# Patient Record
Sex: Male | Born: 2000 | Race: White | Hispanic: No | Marital: Single | Smoking: Never smoker
Health system: Southern US, Community
[De-identification: ages and names within clinical notes are randomized; demographics above are authoritative.]

## PROBLEM LIST (undated history)

## (undated) DIAGNOSIS — L309 Dermatitis, unspecified: Secondary | ICD-10-CM

## (undated) HISTORY — DX: Dermatitis, unspecified: L30.9

---
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
## 2022-01-16 ENCOUNTER — Other Ambulatory Visit: Payer: Self-pay

## 2022-01-16 MED ORDER — SPIRONOLACTONE 100 MG PO TABS
150.0000 mg | ORAL_TABLET | Freq: Two times a day (BID) | ORAL | 11 refills | Status: AC
  Filled 2022-01-16 (×2): qty 90, 30d supply, fill #0
  Filled 2022-06-19: qty 90, 30d supply, fill #1
  Filled 2022-09-11 – 2022-09-14 (×2): qty 90, 30d supply, fill #2
  Filled 2022-11-08 – 2022-11-09 (×3): qty 90, 30d supply, fill #3

## 2022-01-17 ENCOUNTER — Other Ambulatory Visit: Payer: Self-pay

## 2022-01-18 ENCOUNTER — Other Ambulatory Visit: Payer: Self-pay

## 2022-01-22 ENCOUNTER — Other Ambulatory Visit: Payer: Self-pay

## 2022-01-23 ENCOUNTER — Other Ambulatory Visit: Payer: Self-pay

## 2022-01-25 ENCOUNTER — Other Ambulatory Visit: Payer: Self-pay

## 2022-01-26 ENCOUNTER — Other Ambulatory Visit: Payer: Self-pay

## 2022-01-29 ENCOUNTER — Other Ambulatory Visit: Payer: Self-pay

## 2022-01-29 MED ORDER — EUCERIN ORIGINAL HEALING EX CREA
TOPICAL_CREAM | Freq: Two times a day (BID) | CUTANEOUS | 0 refills | Status: AC | PRN
Start: 2022-01-27 — End: ?
  Filled 2022-01-29: qty 120, 10d supply, fill #0

## 2022-01-30 ENCOUNTER — Other Ambulatory Visit: Payer: Self-pay

## 2022-02-06 ENCOUNTER — Other Ambulatory Visit: Payer: Self-pay

## 2022-02-06 DIAGNOSIS — L2089 Other atopic dermatitis: Secondary | ICD-10-CM | POA: Diagnosis not present

## 2022-02-06 MED ORDER — PREDNISONE 20 MG PO TABS
ORAL_TABLET | ORAL | 0 refills | Status: DC
  Filled 2022-02-06: qty 15, 10d supply, fill #0

## 2022-02-06 MED ORDER — TRIAMCINOLONE ACETONIDE 0.1 % EX OINT
TOPICAL_OINTMENT | Freq: Two times a day (BID) | CUTANEOUS | 2 refills | Status: DC
  Filled 2022-02-06: qty 80, 30d supply, fill #0

## 2022-02-19 ENCOUNTER — Other Ambulatory Visit: Payer: Self-pay

## 2022-02-19 DIAGNOSIS — L299 Pruritus, unspecified: Secondary | ICD-10-CM | POA: Diagnosis not present

## 2022-02-19 MED ORDER — PREDNISONE 20 MG PO TABS
ORAL_TABLET | ORAL | 0 refills | Status: AC
  Filled 2022-02-19: qty 18, 9d supply, fill #0

## 2022-02-19 MED ORDER — HYDROXYZINE HCL 25 MG PO TABS
25.0000 mg | ORAL_TABLET | Freq: Three times a day (TID) | ORAL | 0 refills | Status: AC | PRN
  Filled 2022-02-19: qty 30, 10d supply, fill #0

## 2022-02-21 ENCOUNTER — Other Ambulatory Visit: Payer: Self-pay

## 2022-02-21 MED ORDER — EPINEPHRINE 0.3 MG/0.3ML IJ SOAJ
0.3000 mg | Freq: Once | INTRAMUSCULAR | 2 refills | Status: AC
  Filled 2022-02-21: qty 2, 2d supply, fill #0
  Filled 2022-02-21: qty 0.3, 1d supply, fill #0

## 2022-03-06 ENCOUNTER — Other Ambulatory Visit: Payer: Self-pay

## 2022-03-07 ENCOUNTER — Other Ambulatory Visit: Payer: Self-pay

## 2022-03-07 DIAGNOSIS — L500 Allergic urticaria: Secondary | ICD-10-CM | POA: Diagnosis not present

## 2022-03-07 DIAGNOSIS — Z9109 Other allergy status, other than to drugs and biological substances: Secondary | ICD-10-CM | POA: Diagnosis not present

## 2022-03-07 MED ORDER — CETIRIZINE HCL 10 MG PO TABS
10.0000 mg | ORAL_TABLET | Freq: Two times a day (BID) | ORAL | 11 refills | Status: AC
  Filled 2022-03-07: qty 60, 30d supply, fill #0

## 2022-03-07 MED ORDER — MONTELUKAST SODIUM 10 MG PO TABS
10.0000 mg | ORAL_TABLET | Freq: Every day | ORAL | 11 refills | Status: DC
  Filled 2022-03-07: qty 30, 30d supply, fill #0

## 2022-03-16 ENCOUNTER — Other Ambulatory Visit: Payer: Self-pay

## 2022-03-16 ENCOUNTER — Emergency Department
Admission: EM | Admit: 2022-03-16 | Discharge: 2022-03-16 | Disposition: A | Discharge status: Home / Self Care | Payer: Commercial Managed Care - PPO | Attending: Emergency Medicine | Admitting: Emergency Medicine

## 2022-03-16 DIAGNOSIS — R21 Rash and other nonspecific skin eruption: Secondary | ICD-10-CM | POA: Diagnosis present

## 2022-03-16 DIAGNOSIS — L509 Urticaria, unspecified: Secondary | ICD-10-CM | POA: Diagnosis not present

## 2022-03-16 MED ORDER — PREDNISONE 10 MG PO TABS
ORAL_TABLET | ORAL | 0 refills | Status: AC
  Filled 2022-03-16: qty 56, 26d supply, fill #0

## 2022-03-16 NOTE — ED Triage Notes (Signed)
Pt here with an allergic reaction to an unknown cause. Pt states it started in Dec in her face but then spread all over her body. Pt states she has went to another facility but was not told what was causing this reaction. Pt stable in triage.

## 2022-03-16 NOTE — ED Provider Notes (Signed)
[facility] Provider Note    Event Date/Time   First MD Initiated Contact with Patient 03/16/22 [phone]     (approximate)   History   Allergic Reaction   HPI  ZAM ENT is a 22 y.o. male with a past history of allergies who comes ED complaining of generalized rash which is red itchy, been present for the past 3 or 4 months.  This has been a chronic issue that has been managed by primary care in the past.  They recently did allergy blood testing and identified several contributing factors.  Patient has been taking hydroxyzine, Singulair and Zyrtec.  Did a short steroid burst which helped temporarily but now symptoms are worsened.  Denies fevers chills chest pain shortness of breath throat swelling.  No tick exposures or camping or other outdoorsy activities.     Physical Exam   Triage Vital Signs: ED Triage Vitals [03/16/22 0842]  Enc Vitals Group     BP (!) 142/118     Pulse Rate 93     Resp 18     Temp (!) 97.4 F (36.3 C)     Temp Source Oral     SpO2 93 %     Weight 113 lb 1.5 oz (51.3 kg)     Height '5\' 5"'$  (1.651 m)     Head Circumference      Peak Flow      Pain Score 8     Pain Loc      Pain Edu?      Excl. in [place]?     Most recent vital signs: Vitals:   03/16/22 0842  BP: (!) 142/118  Pulse: 93  Resp: 18  Temp: (!) 97.4 F (36.3 C)  SpO2: 93%    General: Awake, no distress.  CV:  Good peripheral perfusion.  Regular rate and rhythm Resp:  Normal effort.  Clear to auscultation bilaterally.  No wheezing Abd:  No distention.  Other:  Moist oral mucosa.  No oropharyngeal swelling, no tongue elevation.  There is generalized urticaria.  No petechia purpura or bullae.  No target lesions   ED Results / Procedures / Treatments   Labs (all labs ordered are listed, but only abnormal results are displayed) Labs Reviewed - No data to display   RADIOLOGY    PROCEDURES:  Procedures   MEDICATIONS ORDERED IN ED: Medications -  No data to display   IMPRESSION / MDM / [place] / ED COURSE  I reviewed the triage vital signs and the nursing notes.                              Patient presents with urticaria, consistent with allergic reaction or atopy.  No edema, no wheezing/stridor.  Continue antihistamines.  Will restart on a long prednisone taper.  Patient has an appointment with dermatology coming up, recommended they seek a allergy immunology specialist as well.  I do not see any reason to suspect RMSF, Lyme, syphilis.  Patient is nontoxic and stable for discharge       FINAL CLINICAL IMPRESSION(S) / ED DIAGNOSES   Final diagnoses:  Urticaria     Rx / DC Orders   ED Discharge Orders          Ordered    predniSONE (DELTASONE) 10 MG tablet  Daily        03/16/22 0903  Note:  This document was prepared using Dragon voice recognition software and may include unintentional dictation errors.   Carrie Mew, MD 03/16/22 [phone]

## 2022-03-27 ENCOUNTER — Other Ambulatory Visit: Payer: Self-pay

## 2022-03-27 ENCOUNTER — Encounter: Payer: Self-pay | Admitting: Internal Medicine

## 2022-03-27 ENCOUNTER — Ambulatory Visit (INDEPENDENT_AMBULATORY_CARE_PROVIDER_SITE_OTHER): Payer: Commercial Managed Care - PPO | Admitting: Internal Medicine

## 2022-03-27 VITALS — BP 102/72 | HR 90 | Temp 98.7°F | Resp 16 | Ht 66.0 in | Wt 120.7 lb

## 2022-03-27 DIAGNOSIS — J3089 Other allergic rhinitis: Secondary | ICD-10-CM

## 2022-03-27 DIAGNOSIS — J302 Other seasonal allergic rhinitis: Secondary | ICD-10-CM | POA: Diagnosis not present

## 2022-03-27 DIAGNOSIS — L2084 Intrinsic (allergic) eczema: Secondary | ICD-10-CM

## 2022-03-27 DIAGNOSIS — L501 Idiopathic urticaria: Secondary | ICD-10-CM | POA: Diagnosis not present

## 2022-03-27 MED ORDER — AZELASTINE HCL 0.1 % NA SOLN
1.0000 | Freq: Two times a day (BID) | NASAL | 5 refills | Status: AC | PRN
  Filled 2022-03-27: qty 30, 30d supply, fill #0
  Filled 2022-08-31: qty 30, 30d supply, fill #1

## 2022-03-27 MED ORDER — HYDROCORTISONE 2.5 % EX CREA
TOPICAL_CREAM | Freq: Two times a day (BID) | CUTANEOUS | 5 refills | Status: AC
  Filled 2022-03-27: qty 30, 7d supply, fill #0

## 2022-03-27 MED ORDER — PIMECROLIMUS 1 % EX CREA
TOPICAL_CREAM | CUTANEOUS | 5 refills | Status: AC
  Filled 2022-03-27: qty 100, 30d supply, fill #0

## 2022-03-27 MED ORDER — FAMOTIDINE 20 MG PO TABS
20.0000 mg | ORAL_TABLET | Freq: Two times a day (BID) | ORAL | 5 refills | Status: AC | PRN
  Filled 2022-03-27: qty 60, 30d supply, fill #0
  Filled 2022-08-31: qty 60, 30d supply, fill #1

## 2022-03-27 MED ORDER — TRIAMCINOLONE ACETONIDE 0.1 % EX OINT
TOPICAL_OINTMENT | CUTANEOUS | 5 refills | Status: AC
  Filled 2022-03-27: qty 80, 30d supply, fill #0
  Filled 2022-04-11: qty 80, 30d supply, fill #1
  Filled 2022-06-06: qty 80, 30d supply, fill #2

## 2022-03-27 MED ORDER — FEXOFENADINE HCL 180 MG PO TABS
180.0000 mg | ORAL_TABLET | Freq: Two times a day (BID) | ORAL | 5 refills | Status: AC | PRN
  Filled 2022-03-27: qty 60, 30d supply, fill #0

## 2022-03-27 MED ORDER — MONTELUKAST SODIUM 10 MG PO TABS
10.0000 mg | ORAL_TABLET | Freq: Every day | ORAL | 5 refills | Status: DC
  Filled 2022-03-27: qty 30, 30d supply, fill #0

## 2022-03-27 NOTE — Progress Notes (Signed)
NEW PATIENT  Date of Service/Encounter:  03/27/22  Consult requested by: Wardell Honour, MD   Subjective:   Michael [name] (DOB: 2001/01/11) is a 22 yo adult who presents to the clinic on 03/27/2022 with a chief complaint of Allergic Rhinitis , Eczema, and Urticaria .    History obtained from: chart review and patient.   Hives: Started over a year ago but worsened since December.  Now breaking out almost several times a week lasting a couple of days. Initially mostly with hives on face but now the rest of the body. Unable to pinpoint a trigger.  Was seen in the ER recently for hives and given a prednisone taper; he is down to '10mg'$  daily.  Also on Singulair '10mg'$  daily. Previously using Zyrtec and Benadryl PRN but has not had it in a few days for this visit. Denies any illness or new medications.  Does not eat much red meat, no tick bites.  Denies scarring or pain with hives.  No episodes of swelling.   Rhinitis:  Started in childhood. Symptoms include: nasal congestion, rhinorrhea, sneezing, watery eyes, and itchy eyes  Occurs seasonally-Spring Potential triggers: pollen Treatments tried:  Flonase PRN Zyrtec PRN  Previous allergy testing: yes with blood testing that showed reactivity trees, grasses, weeds, dust mite, mold, dog, cat History of reflux/heartburn: no History of sinus surgery: no Nonallergic triggers: none   Atopic Dermatitis:  Diagnosed in childhood.  Areas that flare commonly are all over but worse in arms and neck Current regimen: aquaphor ointment, triamcinolone PRN Reports use of fragrance/dye free products Identified triggers of flares include not sure Sleep is not affected Also reports having peeling skin and is due to see Dermatology next month.   Denies any history of frequent infections.  Past Medical History: Past Medical History:  Diagnosis Date   Eczema    Past Surgical History: History reviewed. No pertinent surgical history.  Family  History: History reviewed. No pertinent family history.  Social History:  Lives in a 1-2 year house Flooring in bedroom: wood Pets: cat Tobacco use/exposure: none Job: food [employer]  Medication List:  Allergies as of 03/27/2022   No Known Allergies      Medication List        Accurate as of March 27, 2022  5:20 PM. If you have any questions, ask your nurse or doctor.          Azelastine HCl 137 MCG/SPRAY Soln Place 1 spray into both nostrils 2 (two) times daily as needed for rhinitis or allergies. Use in each nostril as directed Started by: Larose Kells, MD   cetirizine 10 MG tablet Commonly known as: ZYRTEC Take 1 tablet (10 mg total) by mouth 2 (two) times daily   estradiol 2 MG tablet Commonly known as: ESTRACE TAKE 1 TABLET BY MOUTH ONCE DAILY   estradiol 2 MG tablet Commonly known as: ESTRACE Take 2 tablets (4 mg total) by mouth once daily   estradiol 2 MG tablet Commonly known as: ESTRACE Take 2 tablets (4 mg total) by mouth once daily   estradiol 2 MG tablet Commonly known as: ESTRACE Take 3 tablets (6 mg total) by mouth once daily   famotidine 20 MG tablet Commonly known as: Pepcid Take 1 tablet (20 mg total) by mouth 2 (two) times daily as needed. Started by: Larose Kells, MD   fexofenadine 180 MG tablet Commonly known as: Allegra Allergy Take 1 tablet (180 mg total) by mouth 2 (two)  times daily as needed. Started by: Larose Kells, MD   fluticasone 50 MCG/ACT nasal spray Commonly known as: FLONASE Place 2 sprays into both nostrils daily.   hydrocortisone 2.5 % cream Apply topically 2 (two) times daily. Apply twice daily for flare ups on face, maximum 7 days. Started by: Larose Kells, MD   hydrOXYzine 25 MG tablet Commonly known as: ATARAX Take 1 tablet (25 mg total) by mouth 3 (three) times daily as needed for itching.   meclizine 25 MG tablet Commonly known as: ANTIVERT Take 1 tablet (25 mg total) by mouth 3 (three) times daily as  needed for dizziness.   montelukast 10 MG tablet Commonly known as: SINGULAIR Take 1 tablet (10 mg total) by mouth at bedtime   Opzelura 1.5 % Crea Generic drug: Ruxolitinib Phosphate Apply a thin layer twice daily to affected areas. Do no exceed one tube in a week   pimecrolimus 1 % cream Commonly known as: ELIDEL Apply twice daily and wean as tolerated. Started by: Larose Kells, MD   predniSONE 20 MG tablet Commonly known as: DELTASONE Take 2 tablets (40 mg total) by mouth once daily   predniSONE 5 MG tablet Commonly known as: DELTASONE Take 6 tabs daily for 2 days, 5 tabs daily for 2 days, 4 tabs daily for 2 days, 3 tabs daily for 2 days, 2 tabs for 2 days, 1 tab daily for 2 days then stop.   predniSONE 10 MG tablet Commonly known as: DELTASONE Take 5 tablets (50 mg total) by mouth daily for 3 days, THEN 4 tablets (40 mg total) daily for 3 days, THEN 3 tablets (30 mg total) daily for 3 days, THEN 2 tablets (20 mg total) daily for 3 days, THEN 1 tablet (10 mg total) daily for 14 days. Start taking on: March 16, 2022   spironolactone 100 MG tablet Commonly known as: ALDACTONE TAKE 1 TABLET BY MOUTH 2 TIMES DAILY   spironolactone 100 MG tablet Commonly known as: ALDACTONE Take 1 tablet (100 mg total) by mouth 2 (two) times daily   spironolactone 100 MG tablet Commonly known as: ALDACTONE Take 1.5 tablets (150 mg total) by mouth 2 (two) times daily.   triamcinolone cream 0.1 % Commonly known as: KENALOG Apply to the affected area(s) two times daily (Apply topically 2 (two) times daily) What changed: Another medication with the same name was changed. Make sure you understand how and when to take each. Changed by: Larose Kells, MD   triamcinolone ointment 0.1 % Commonly known as: KENALOG Apply twice daily for flare ups below face, maximum 10 days. What changed:  how to take this when to take this additional instructions Changed by: Larose Kells, MD    triamcinolone cream-eucerin cream Apply to affected skin two times daily as needed (Compounded 1:1 with Eucerin. Apply to affected skin BID prn.)   triamcinolone cream-Eucerin Original Healing Apply to affected skin 2 (two) times daily as needed.   valACYclovir 1000 MG tablet Commonly known as: VALTREX Take 1 tablet (1,000 mg total) by mouth 2 (two) times daily.         REVIEW OF SYSTEMS: Pertinent positives and negatives discussed in HPI.   Objective:   Physical Exam: BP 102/72   Pulse 90   Temp 98.7 F (37.1 C) (Temporal)   Resp 16   Ht '5\' 6"'$  (1.676 m)   Wt 120 lb 11.2 oz (54.7 kg)   SpO2 99%   BMI 19.48 kg/m  Body mass index is 19.48 kg/m. GEN: alert, well developed HEENT: clear conjunctiva, TM grey and translucent, nose with + inferior turbinate hypertrophy, pink nasal mucosa, slight clear rhinorrhea, no cobblestoning HEART: regular rate and rhythm, no murmur LUNGS: clear to auscultation bilaterally, no coughing, unlabored respiration ABDOMEN: soft, non distended  SKIN: no rashes or lesions  Reviewed:  03/16/2022: seen in ER for urticaria, noted to have diffuse urticaria on exam.  Started on long prednisone taper and discharged home.  03/07/2022: seen by [place] PA for rash.  Given prednisone at last visit. Had multiple positives to environmental and food allergens.   Was given Epipen.  Not avoiding allergens.  Uses hydroxyzine PRN and benadryl PRN. Also informed to start Zyrtec '10mg'$  BID and Singulair daily.  02/06/2022: seen for eczema with frequent flare ups.  On triamcinolone, aquaphor.  Referred to Dermatology and given prednisone taper.   Skin Testing:  Skin prick testing was placed, which includes aeroallergens/foods, histamine control, and saline control.  Verbal consent was obtained prior to placing test.  Patient tolerated procedure well.  Allergy testing results were read and interpreted by myself, documented by clinical staff. Adequate positive and  negative control.  Results discussed with patient/family.  Airborne Adult Perc - 03/27/22 1447     Time Antigen Placed 1447    Allergen Manufacturer Lavella Hammock    Location Back    Number of Test 58    1. Control-Buffer 50% Glycerol Negative    2. Control-Histamine 1 mg/ml 3+    3. Albumin saline Negative    4. [place] Negative    5. [place] Negative    6. [place] Negative    7. Eldora Blue Negative    9. Perennial Rye Negative    10. Sweet Vernal Negative    11. Timothy 2+    12. Cocklebur Negative    13. Burweed Marshelder Negative    14. Ragweed, short 3+    15. Ragweed, Giant 2+    16. Plantain,  English Negative    17. Lamb's Quarters Negative    18. Sheep Sorrell 3+    19. Rough Pigweed Negative    20. Marsh Elder, Rough Negative    21. Mugwort, Common Negative    22. Ash mix Negative    23. Birch mix 3+    24. Beech American 3+    25. Box, Elder Negative    26. Cedar, red 3+    27. Cottonwood, Eastern 3+    28. Elm mix Negative    29. Hickory 3+    30. Maple mix 2+    31. Oak, [place] mix 3+    32. Pecan Pollen Negative    33. Pine mix Negative    34. Sycamore Eastern Negative    35. [place], Black Pollen Negative    36. Alternaria alternata Negative    37. Cladosporium Herbarum 2+    38. Aspergillus mix 2+    39. Penicillium mix 2+    40. Bipolaris sorokiniana (Helminthosporium) Negative    41. Drechslera spicifera (Curvularia) Negative    42. Mucor plumbeus Negative    43. Fusarium moniliforme Negative    44. Aureobasidium pullulans (pullulara) Negative    45. Rhizopus oryzae Negative    46. Botrytis cinera Negative    47. Epicoccum nigrum Negative    48. Phoma betae Negative    49. Candida Albicans Negative    50. Trichophyton mentagrophytes 2+    51. Mite, D Farinae  5,000 AU/ml 3+  52. Mite, D Pteronyssinus  5,000 AU/ml 3+    53. Cat Hair 10,000 BAU/ml 2+    54.  Dog Epithelia Negative    55. Mixed Feathers Negative    56. Horse Epithelia 3+     57. Cockroach, German 2+    58. Mouse Negative    59. Tobacco Leaf Negative               Assessment:   1. Idiopathic urticaria   2. Intrinsic atopic dermatitis   3. Seasonal and perennial allergic rhinitis     Plan/Recommendations:  Idiopathic Urticaria (Hives): - At this time etiology of hives and swelling is unknown. Hives can be caused by a variety of different triggers including illness/infection, exercise, pressure, vibrations, extremes of temperature to name a few however majority of the time there is no identifiable trigger.  - If persistent, we can consider lab testing at next visit.   -Start Allegra '180mg'$  twice daily.  Continue Singulair '10mg'$  daily.  -If no improvement in 5-7 days, add Pepcid '20mg'$  twice daily and continue Allegra '180mg'$  twice daily. -If still no improvement, increase to Allegra '360mg'$  twice daily and Pepcid '40mg'$  twice daily.  Eczema: - Do a daily soaking tub bath in warm water for 10-15 minutes.  - Use a gentle, unscented cleanser at the end of the bath (such as Dove unscented bar or baby wash, or Aveeno sensitive body wash). Then rinse, pat half-way dry, and apply a gentle, unscented moisturizer cream or ointment (Cerave, Cetaphil, Eucerin, Aveeno, Aquaphor)  all over while still damp. Dry skin makes the itching and rash of eczema worse. The skin should be moisturized with a gentle, unscented moisturizer at least twice daily.  - Use only unscented liquid laundry detergent. - Apply prescribed topical steroid (triamcinolone 0.1% below neck or hydrocortisone 2.5% above neck) to flared areas (red and thickened eczema) after the moisturizer has soaked into the skin (wait at least 30 minutes). Taper off the topical steroids as the skin improves. Do not use topical steroid for more than 7-10 days at a time.  - Put Protopic/Elidel onto areas of rough eczema (that is not red) twice a day. May decrease to once a day as the eczema improves. This will not thin the skin,  and is safe for chronic use. Do not put this onto normal appearing skin. - Consider Dupixent.   - Follow up with Dermatology regarding skin peeling.    Allergic Rhinitis: - Due to turbinate hypertrophy, seasonal symptoms and unresponsive to OTC meds, performed skin testing to identify aeroallergen triggers.   - Positive skin test 03/2022: trees, grasses, weeds, mold, dust mite, cat, cockroach, horse  - Avoidance measures discussed. - Use nasal saline rinses before nose sprays such as with Neilmed Sinus Rinse.  Use distilled water.   - Use Azelastine 1-2 sprays each nostril twice daily as needed. Aim upward and outward. - Use Allegra '180mg'$  daily as needed.   - Use Singulair '10mg'$  daily.  Stop if there are any mood/behavioral changes. - Consider allergy shots as long term control of your symptoms by teaching your immune system to be more tolerant of your allergy triggers     Return in about 6 weeks (around 05/08/2022).  Harlon Flor, MD [facility] [place] [facility] [place]

## 2022-03-27 NOTE — Patient Instructions (Addendum)
Idiopathic Urticaria (Hives): - At this time etiology of hives and swelling is unknown. Hives can be caused by a variety of different triggers including illness/infection, exercise, pressure, vibrations, extremes of temperature to name a few however majority of the time there is no identifiable trigger.  -Start Allegra '180mg'$  twice daily.  Continue Singulair '10mg'$  daily.  -If no improvement in 5-7 days, add Pepcid '20mg'$  twice daily and continue Allegra '180mg'$  twice daily. -If still no improvement, increase to Allegra '360mg'$  twice daily and Pepcid '40mg'$  twice daily.  Eczema: - Do a daily soaking tub bath in warm water for 10-15 minutes.  - Use a gentle, unscented cleanser at the end of the bath (such as Dove unscented bar or baby wash, or Aveeno sensitive body wash). Then rinse, pat half-way dry, and apply a gentle, unscented moisturizer cream or ointment (Cerave, Cetaphil, Eucerin, Aveeno, Aquaphor)  all over while still damp. Dry skin makes the itching and rash of eczema worse. The skin should be moisturized with a gentle, unscented moisturizer at least twice daily.  - Use only unscented liquid laundry detergent. - Apply prescribed topical steroid (triamcinolone 0.1% below neck or hydrocortisone 2.5% above neck) to flared areas (red and thickened eczema) after the moisturizer has soaked into the skin (wait at least 30 minutes). Taper off the topical steroids as the skin improves. Do not use topical steroid for more than 7-10 days at a time.  - Put Protopic/Elidel onto areas of rough eczema (that is not red) twice a day. May decrease to once a day as the eczema improves. This will not thin the skin, and is safe for chronic use. Do not put this onto normal appearing skin. - Consider Dupixent.   - Follow up with Dermatology regarding skin peeling.    Allergic Rhinitis: - Due to turbinate hypertrophy, seasonal symptoms and unresponsive to OTC meds, performed skin testing to identify aeroallergen triggers.    - Positive skin test 03/2022: trees, grasses, weeds, mold, dust mite, cat, cockroach, horse  - Avoidance measures discussed. - Use nasal saline rinses before nose sprays such as with Neilmed Sinus Rinse.  Use distilled water.   - Use Azelastine 1-2 sprays each nostril twice daily as needed. Aim upward and outward. - Use Allegra '180mg'$  daily as needed.   - Use Singulair '10mg'$  daily.  Stop if there are any mood/behavioral changes. - Consider allergy shots as long term control of your symptoms by teaching your immune system to be more tolerant of your allergy triggers   ALLERGEN AVOIDANCE MEASURES   Dust Mites Use central air conditioning and heat; and change the filter monthly.  Pleated filters work better than mesh filters.  Electrostatic filters may also be used; wash the filter monthly.  Window air conditioners may be used, but do not clean the air as well as a central air conditioner.  Change or wash the filter monthly. Keep windows closed.  Do not use attic fans.   Encase the mattress, box springs and pillows with zippered, dust proof covers. Wash the bed linens in hot water weekly.   Remove carpet, especially from the bedroom. Remove stuffed animals, throw pillows, dust ruffles, heavy drapes and other items that collect dust from the bedroom. Do not use a humidifier.   Use wood, vinyl or leather furniture instead of cloth furniture in the bedroom. Keep the indoor humidity at 30 - 40%.  Monitor with a humidity gauge.  Molds - Indoor avoidance Use air conditioning to reduce indoor humidity.  Do not use a humidifier. Keep indoor humidity at 30 - 40%.  Use a dehumidifier if needed. In the bathroom use an exhaust fan or open a window after showering.  Wipe down damp surfaces after showering.  Clean bathrooms with a mold-killing solution (diluted bleach, or products like Tilex, etc) at least once a month. In the kitchen use an exhaust fan to remove steam from cooking.  Throw away spoiled  foods immediately, and empty garbage daily.  Empty water pans below self-defrosting refrigerators frequently. Vent the clothes dryer to the outside. Limit indoor houseplants; mold grows in the dirt.  No houseplants in the bedroom. Remove carpet from the bedroom. Encase the mattress and box springs with a zippered encasing.  Molds - Outdoor avoidance Avoid being outside when the grass is being mowed, or the ground is tilled. Avoid playing in leaves, pine straw, hay, etc.  Dead plant materials contain mold. Avoid going into barns or grain storage areas. Remove leaves, clippings and compost from around the home.  Cockroach Limit spread of food around the house; especially keep food out of bedrooms. Keep food and garbage in closed containers with a tight lid.  Never leave food out in the kitchen.  Do not leave out pet food or dirty food bowls. Mop the kitchen floor and wash countertops at least once a week. Repair leaky pipes and faucets so there is no standing water to attract roaches. Plug up cracks in the house through which cockroaches can enter. Use bait stations and approved pesticides to reduce cockroach infestation. Pollen Avoidance Pollen levels are highest during the mid-day and afternoon.  Consider this when planning outdoor activities. Avoid being outside when the grass is being mowed, or wear a mask if the pollen-allergic person must be the one to mow the grass. Keep the windows closed to keep pollen outside of the home. Use an air conditioner to filter the air. Take a shower, wash hair, and change clothing after working or playing outdoors during pollen season. Pet Dander Keep the pet out of your bedroom and restrict it to only a few rooms. Be advised that keeping the pet in only one room will not limit the allergens to that room. Don't pet, hug or kiss the pet; if you do, wash your hands with soap and water. High-efficiency particulate air (HEPA) cleaners run continuously in a  bedroom or living room can reduce allergen levels over time. Regular use of a high-efficiency vacuum cleaner or a central vacuum can reduce allergen levels. Giving your pet a bath at least once a week can reduce airborne allergen.

## 2022-03-28 ENCOUNTER — Other Ambulatory Visit: Payer: Self-pay

## 2022-03-29 ENCOUNTER — Other Ambulatory Visit: Payer: Self-pay

## 2022-04-02 ENCOUNTER — Other Ambulatory Visit: Payer: Self-pay

## 2022-04-03 ENCOUNTER — Other Ambulatory Visit: Payer: Self-pay

## 2022-04-11 ENCOUNTER — Other Ambulatory Visit: Payer: Self-pay

## 2022-04-13 ENCOUNTER — Other Ambulatory Visit: Payer: Self-pay

## 2022-04-17 ENCOUNTER — Other Ambulatory Visit: Payer: Self-pay

## 2022-04-17 DIAGNOSIS — L2089 Other atopic dermatitis: Secondary | ICD-10-CM | POA: Diagnosis not present

## 2022-04-17 MED ORDER — DOXYCYCLINE HYCLATE 100 MG PO CAPS
100.0000 mg | ORAL_CAPSULE | Freq: Two times a day (BID) | ORAL | 0 refills | Status: AC
  Filled 2022-04-17: qty 14, 7d supply, fill #0

## 2022-04-17 MED ORDER — BETAMETHASONE DIPROPIONATE AUG 0.05 % EX OINT
1.0000 | TOPICAL_OINTMENT | Freq: Two times a day (BID) | CUTANEOUS | 1 refills | Status: DC
  Filled 2022-04-17: qty 50, 25d supply, fill #0
  Filled 2022-06-06: qty 50, 25d supply, fill #1

## 2022-04-19 ENCOUNTER — Other Ambulatory Visit: Payer: Self-pay

## 2022-04-19 MED ORDER — "BD LUER-LOK SYRINGE 22G X 1"" 3 ML MISC"
2 refills | Status: AC
  Filled 2022-04-19 – 2022-04-23 (×2): qty 12, 84d supply, fill #0

## 2022-04-19 MED ORDER — ESTRADIOL VALERATE 20 MG/ML IM OIL
5.0000 mg | TOPICAL_OIL | INTRAMUSCULAR | 2 refills | Status: DC
  Filled 2022-04-19: qty 5, 90d supply, fill #0

## 2022-04-19 MED ORDER — "BD DISP NEEDLES 27G X 1/2"" MISC"
2 refills | Status: DC
  Filled 2022-04-19: qty 12, 84d supply, fill #0

## 2022-04-23 ENCOUNTER — Other Ambulatory Visit: Payer: Self-pay

## 2022-05-08 ENCOUNTER — Ambulatory Visit: Payer: Commercial Managed Care - PPO | Admitting: Internal Medicine

## 2022-05-08 DIAGNOSIS — J309 Allergic rhinitis, unspecified: Secondary | ICD-10-CM

## 2022-05-09 ENCOUNTER — Ambulatory Visit (INDEPENDENT_AMBULATORY_CARE_PROVIDER_SITE_OTHER): Payer: Commercial Managed Care - PPO | Admitting: Family

## 2022-05-09 ENCOUNTER — Other Ambulatory Visit: Payer: Self-pay

## 2022-05-09 ENCOUNTER — Encounter: Payer: Self-pay | Admitting: Family

## 2022-05-09 VITALS — BP 100/60 | HR 88 | Temp 98.4°F | Resp 16 | Ht 66.0 in | Wt 127.8 lb

## 2022-05-09 DIAGNOSIS — J302 Other seasonal allergic rhinitis: Secondary | ICD-10-CM

## 2022-05-09 DIAGNOSIS — L2084 Intrinsic (allergic) eczema: Secondary | ICD-10-CM | POA: Diagnosis not present

## 2022-05-09 DIAGNOSIS — L501 Idiopathic urticaria: Secondary | ICD-10-CM

## 2022-05-09 DIAGNOSIS — J3089 Other allergic rhinitis: Secondary | ICD-10-CM | POA: Diagnosis not present

## 2022-05-09 MED ORDER — MONTELUKAST SODIUM 10 MG PO TABS
10.0000 mg | ORAL_TABLET | Freq: Every day | ORAL | 5 refills | Status: AC
  Filled 2022-05-09: qty 30, 30d supply, fill #0
  Filled 2022-08-31: qty 30, 30d supply, fill #1

## 2022-05-09 NOTE — Progress Notes (Signed)
[address] [place] [address] Dept: [phone]  FOLLOW UP NOTE  Patient ID: Michael [name], male    DOB: 2000-07-01  Age: 22 y.o. MRN: [id number] Date of Office Visit: 05/09/2022  Assessment  Chief Complaint: Follow-up  HPI Michael [name] is a 22 year old who presents today for follow-up of idiopathic urticaria, intrinsic atopic dermatitis, and seasonal and perennial allergic rhinitis.  Michael [name] was last seen on April 04, 2022 by Dr. Allena Katz .Khian denies any new diagnosis or surgeries since the last office visit.  Idiopathic urticaria: Michael [name] reports that taking hormones was the cause of the hives and itching.  Also, the red dye in Cheerwine caused redness/blotchiness of face.  Michael [name] is no longer taking spironolactone and is taking estradiol.  Michael [name] is no longer taking Allegra, Pepcid, and Singulair daily and is using them just as needed now.  Eczema is reported as clear.  Sometimes Michael [name] eczema will flare on the legs, arms and sometimes neck.  Michael [name] has not had any skin infections since last office visit.  Michael [name] has triamcinolone 0.1% to use as needed, hydrocortisone 2.5% to use as needed, and Protopic or Elidel to use as needed.  Michael [name] reports the peeling on  face is better, but still occurs on scalp. Michael [name] has an upcoming appointment with dermatologist.  Allergic rhinitis: Michael [name] denies rhinorrhea, nasal congestion, and postnasal drip.  Michael [name] has not had any sinus infections since the last office visit.  Michael [name] uses Singulair 10 mg as needed and would like a refill, Allegra 180 mg as needed, and azelastine nasal spray as needed.   Drug Allergies:  No Known Allergies  Review of Systems: Review of Systems  Constitutional:  Negative for chills and fever.  HENT:         Denies rhinorrhea, nasal congestion, and post nasal drip  Eyes:        Denies itchy watery eyes  Respiratory:  Negative for cough, shortness of breath and wheezing.   Cardiovascular:  Negative for  chest pain and palpitations.  Gastrointestinal:        Denies heartburn and reflux  Skin:        Reports peeling skin on scalp. Peeling on face is better  Neurological:  Negative for headaches.  Endo/Heme/Allergies:  Positive for environmental allergies.     Physical Exam: BP 100/60   Pulse 88   Temp 98.4 F (36.9 C) (Temporal)   Resp 16   Ht  (1.676 m)   Wt 127 lb 12.8 oz (58 kg)   SpO2 97%   BMI 20.63 kg/m    Physical Exam Constitutional:      Appearance: Normal appearance. He is normal weight.  HENT:     Head: Normocephalic and atraumatic.     Comments: Pharynx normal. Eyes normal. Ears normal. Ears normal    Right Ear: Tympanic membrane, ear canal and external ear normal.     Left Ear: Tympanic membrane, ear canal and external ear normal.     Nose: Nose normal.     Mouth/Throat:     Mouth: Mucous membranes are moist.     Pharynx: Oropharynx is clear.  Eyes:     Conjunctiva/sclera: Conjunctivae normal.  Cardiovascular:     Rate and Rhythm: Regular rhythm.     Heart sounds: Normal heart sounds.  Pulmonary:     Effort: Pulmonary effort is normal.     Breath sounds: Normal breath sounds.     Comments: Lungs clear to auscultation Musculoskeletal:  Cervical back: Neck supple.  Skin:    General: Skin is warm.     Comments: No rashes or urticarial lesions noted  Neurological:     Mental Status: He is alert and oriented to person, place, and time.  Psychiatric:        Mood and Affect: Mood normal.        Behavior: Behavior normal.        Thought Content: Thought content normal.        Judgment: Judgment normal.     Diagnostics:  none  Assessment and Plan: 1. Idiopathic urticaria   2. Intrinsic atopic dermatitis   3. Seasonal and perennial allergic rhinitis     No orders of the defined types were placed in this encounter.   Patient Instructions  Idiopathic Urticaria (Hives): - Better since stopping spironolactone and Cheerwine -Stop Allegra  180mg  for hives, but may use once a day as needed for allergies.  Continue Singulair 10mg  daily-this will also help allergies.  -Stop Pepcid 20mg  since being off for 2 weeks and no reoccurrence  Eczema: - Do a daily soaking tub bath in warm water for 10-15 minutes.  - Use a gentle, unscented cleanser at the end of the bath (such as Dove unscented bar or baby wash, or Aveeno sensitive body wash). Then rinse, pat half-way dry, and apply a gentle, unscented moisturizer cream or ointment (Cerave, Cetaphil, Eucerin, Aveeno, Aquaphor)  all over while still damp. Dry skin makes the itching and rash of eczema worse. The skin should be moisturized with a gentle, unscented moisturizer at least twice daily.  - Use only unscented liquid laundry detergent. - Apply prescribed topical steroid (triamcinolone 0.1% below neck or hydrocortisone 2.5% above neck) to flared areas (red and thickened eczema) after the moisturizer has soaked into the skin (wait at least 30 minutes). Taper off the topical steroids as the skin improves. Do not use topical steroid for more than 7-10 days at a time.  - Put Protopic/Elidel onto areas of rough eczema (that is not red) twice a day. May decrease to once a day as the eczema improves. This will not thin the skin, and is safe for chronic use. Do not put this onto normal appearing skin. - Consider Dupixent.   - Follow up with Dermatology regarding skin peeling.    Allergic Rhinitis: - Due to turbinate hypertrophy, seasonal symptoms and unresponsive to OTC meds, performed skin testing to identify aeroallergen triggers.   - Positive skin test 03/2022: trees, grasses, weeds, mold, dust mite, cat, cockroach, horse  - Avoidance measures discussed. - Use nasal saline rinses before nose sprays such as with Neilmed Sinus Rinse.  Use distilled water.   - Use Azelastine 1-2 sprays each nostril twice daily as needed. Aim upward and outward. - Use Allegra 180mg  daily as needed.   - Use Singulair  10mg  daily.  Stop if there are any mood/behavioral changes. - Consider allergy shots as long term control of your symptoms by teaching your immune system to be more tolerant of your allergy triggers   Follow up in 4-6 months or sooner if needed  ALLERGEN AVOIDANCE MEASURES   Dust Mites Use central air conditioning and heat; and change the filter monthly.  Pleated filters work better than mesh filters.  Electrostatic filters may also be used; wash the filter monthly.  Window air conditioners may be used, but do not clean the air as well as a central air conditioner.  Change or wash the filter monthly.  Keep windows closed.  Do not use attic fans.   Encase the mattress, box springs and pillows with zippered, dust proof covers. Wash the bed linens in hot water weekly.   Remove carpet, especially from the bedroom. Remove stuffed animals, throw pillows, dust ruffles, heavy drapes and other items that collect dust from the bedroom. Do not use a humidifier.   Use wood, vinyl or leather furniture instead of cloth furniture in the bedroom. Keep the indoor humidity at 30 - 40%.  Monitor with a humidity gauge.  Molds - Indoor avoidance Use air conditioning to reduce indoor humidity.  Do not use a humidifier. Keep indoor humidity at 30 - 40%.  Use a dehumidifier if needed. In the bathroom use an exhaust fan or open a window after showering.  Wipe down damp surfaces after showering.  Clean bathrooms with a mold-killing solution (diluted bleach, or products like Tilex, etc) at least once a month. In the kitchen use an exhaust fan to remove steam from cooking.  Throw away spoiled foods immediately, and empty garbage daily.  Empty water pans below self-defrosting refrigerators frequently. Vent the clothes dryer to the outside. Limit indoor houseplants; mold grows in the dirt.  No houseplants in the bedroom. Remove carpet from the bedroom. Encase the mattress and box springs with a zippered  encasing.  Molds - Outdoor avoidance Avoid being outside when the grass is being mowed, or the ground is tilled. Avoid playing in leaves, pine straw, hay, etc.  Dead plant materials contain mold. Avoid going into barns or grain storage areas. Remove leaves, clippings and compost from around the home.  Cockroach Limit spread of food around the house; especially keep food out of bedrooms. Keep food and garbage in closed containers with a tight lid.  Never leave food out in the kitchen.  Do not leave out pet food or dirty food bowls. Mop the kitchen floor and wash countertops at least once a week. Repair leaky pipes and faucets so there is no standing water to attract roaches. Plug up cracks in the house through which cockroaches can enter. Use bait stations and approved pesticides to reduce cockroach infestation. Pollen Avoidance Pollen levels are highest during the mid-day and afternoon.  Consider this when planning outdoor activities. Avoid being outside when the grass is being mowed, or wear a mask if the pollen-allergic person must be the one to mow the grass. Keep the windows closed to keep pollen outside of the home. Use an air conditioner to filter the air. Take a shower, wash hair, and change clothing after working or playing outdoors during pollen season. Pet Dander Keep the pet out of your bedroom and restrict it to only a few rooms. Be advised that keeping the pet in only one room will not limit the allergens to that room. Don't pet, hug or kiss the pet; if you do, wash your hands with soap and water. High-efficiency particulate air (HEPA) cleaners run continuously in a bedroom or living room can reduce allergen levels over time. Regular use of a high-efficiency vacuum cleaner or a central vacuum can reduce allergen levels. Giving your pet a bath at least once a week can reduce airborne allergen.  Return in about 6 months (around 11/08/2022), or if symptoms worsen or fail to  improve.    Thank you for the opportunity to care for this patient.  Please do not hesitate to contact me with questions.  [name], FNP [facility]

## 2022-05-09 NOTE — Patient Instructions (Addendum)
Idiopathic Urticaria (Hives): - Better since stopping spironolactone and Cheerwine -Stop Allegra  for hives, but may use once a day as needed for allergies.  Continue Singulair  daily-this will also help allergies.  -Stop Pepcid  since being off for 2 weeks and no reoccurrence  Eczema: - Do a daily soaking tub bath in warm water for 10-15 minutes.  - Use a gentle, unscented cleanser at the end of the bath (such as Dove unscented bar or baby wash, or Aveeno sensitive body wash). Then rinse, pat half-way dry, and apply a gentle, unscented moisturizer cream or ointment (Cerave, Cetaphil, Eucerin, Aveeno, Aquaphor)  all over while still damp. Dry skin makes the itching and rash of eczema worse. The skin should be moisturized with a gentle, unscented moisturizer at least twice daily.  - Use only unscented liquid laundry detergent. - Apply prescribed topical steroid (triamcinolone 0.1% below neck or hydrocortisone 2.5% above neck) to flared areas (red and thickened eczema) after the moisturizer has soaked into the skin (wait at least 30 minutes). Taper off the topical steroids as the skin improves. Do not use topical steroid for more than 7-10 days at a time.  - Put Protopic/Elidel onto areas of rough eczema (that is not red) twice a day. May decrease to once a day as the eczema improves. This will not thin the skin, and is safe for chronic use. Do not put this onto normal appearing skin. - Consider Dupixent.   - Follow up with Dermatology regarding skin peeling.    Allergic Rhinitis: - Due to turbinate hypertrophy, seasonal symptoms and unresponsive to OTC meds, performed skin testing to identify aeroallergen triggers.   - Positive skin test 03/2022: trees, grasses, weeds, mold, dust mite, cat, cockroach, horse  - Avoidance measures discussed. - Use nasal saline rinses before nose sprays such as with Neilmed Sinus Rinse.  Use distilled water.   - Use Azelastine 1-2 sprays each nostril twice  daily as needed. Aim upward and outward. - Use Allegra  daily as needed.   - Use Singulair  daily.  Stop if there are any mood/behavioral changes. - Consider allergy shots as long term control of your symptoms by teaching your immune system to be more tolerant of your allergy triggers   Follow up in 4-6 months or sooner if needed  ALLERGEN AVOIDANCE MEASURES   Dust Mites Use central air conditioning and heat; and change the filter monthly.  Pleated filters work better than mesh filters.  Electrostatic filters may also be used; wash the filter monthly.  Window air conditioners may be used, but do not clean the air as well as a central air conditioner.  Change or wash the filter monthly. Keep windows closed.  Do not use attic fans.   Encase the mattress, box springs and pillows with zippered, dust proof covers. Wash the bed linens in hot water weekly.   Remove carpet, especially from the bedroom. Remove stuffed animals, throw pillows, dust ruffles, heavy drapes and other items that collect dust from the bedroom. Do not use a humidifier.   Use wood, vinyl or leather furniture instead of cloth furniture in the bedroom. Keep the indoor humidity at 30 - 40%.  Monitor with a humidity gauge.  Molds - Indoor avoidance Use air conditioning to reduce indoor humidity.  Do not use a humidifier. Keep indoor humidity at 30 - 40%.  Use a dehumidifier if needed. In the bathroom use an exhaust fan or open a window after showering.  Wipe  down damp surfaces after showering.  Clean bathrooms with a mold-killing solution (diluted bleach, or products like Tilex, etc) at least once a month. In the kitchen use an exhaust fan to remove steam from cooking.  Throw away spoiled foods immediately, and empty garbage daily.  Empty water pans below self-defrosting refrigerators frequently. Vent the clothes dryer to the outside. Limit indoor houseplants; mold grows in the dirt.  No houseplants in the  bedroom. Remove carpet from the bedroom. Encase the mattress and box springs with a zippered encasing.  Molds - Outdoor avoidance Avoid being outside when the grass is being mowed, or the ground is tilled. Avoid playing in leaves, pine straw, hay, etc.  Dead plant materials contain mold. Avoid going into barns or grain storage areas. Remove leaves, clippings and compost from around the home.  Cockroach Limit spread of food around the house; especially keep food out of bedrooms. Keep food and garbage in closed containers with a tight lid.  Never leave food out in the kitchen.  Do not leave out pet food or dirty food bowls. Mop the kitchen floor and wash countertops at least once a week. Repair leaky pipes and faucets so there is no standing water to attract roaches. Plug up cracks in the house through which cockroaches can enter. Use bait stations and approved pesticides to reduce cockroach infestation. Pollen Avoidance Pollen levels are highest during the mid-day and afternoon.  Consider this when planning outdoor activities. Avoid being outside when the grass is being mowed, or wear a mask if the pollen-allergic person must be the one to mow the grass. Keep the windows closed to keep pollen outside of the home. Use an air conditioner to filter the air. Take a shower, wash hair, and change clothing after working or playing outdoors during pollen season. Pet Dander Keep the pet out of your bedroom and restrict it to only a few rooms. Be advised that keeping the pet in only one room will not limit the allergens to that room. Don't pet, hug or kiss the pet; if you do, wash your hands with soap and water. High-efficiency particulate air (HEPA) cleaners run continuously in a bedroom or living room can reduce allergen levels over time. Regular use of a high-efficiency vacuum cleaner or a central vacuum can reduce allergen levels. Giving your pet a bath at least once a week can reduce airborne  allergen.

## 2022-06-06 ENCOUNTER — Other Ambulatory Visit: Payer: Self-pay

## 2022-06-22 ENCOUNTER — Other Ambulatory Visit: Payer: Self-pay

## 2022-06-25 ENCOUNTER — Other Ambulatory Visit: Payer: Self-pay

## 2022-06-26 ENCOUNTER — Other Ambulatory Visit: Payer: Self-pay

## 2022-06-27 DIAGNOSIS — Z7251 High risk heterosexual behavior: Secondary | ICD-10-CM | POA: Diagnosis not present

## 2022-06-27 DIAGNOSIS — R319 Hematuria, unspecified: Secondary | ICD-10-CM | POA: Diagnosis not present

## 2022-06-27 DIAGNOSIS — N3001 Acute cystitis with hematuria: Secondary | ICD-10-CM | POA: Diagnosis not present

## 2022-06-28 ENCOUNTER — Other Ambulatory Visit: Payer: Self-pay

## 2022-06-28 MED ORDER — CIPROFLOXACIN HCL 500 MG PO TABS
500.0000 mg | ORAL_TABLET | Freq: Two times a day (BID) | ORAL | 0 refills | Status: AC
  Filled 2022-06-28: qty 14, 7d supply, fill #0

## 2022-07-17 ENCOUNTER — Other Ambulatory Visit: Payer: Self-pay

## 2022-07-18 ENCOUNTER — Other Ambulatory Visit: Payer: Self-pay

## 2022-08-03 ENCOUNTER — Other Ambulatory Visit: Payer: Self-pay

## 2022-08-06 ENCOUNTER — Other Ambulatory Visit: Payer: Self-pay

## 2022-08-20 ENCOUNTER — Ambulatory Visit: Payer: Commercial Managed Care - PPO | Attending: Family Medicine | Admitting: Pharmacist

## 2022-08-20 ENCOUNTER — Other Ambulatory Visit (HOSPITAL_COMMUNITY): Payer: Self-pay

## 2022-08-20 ENCOUNTER — Other Ambulatory Visit: Payer: Self-pay

## 2022-08-20 DIAGNOSIS — Z79899 Other long term (current) drug therapy: Secondary | ICD-10-CM

## 2022-08-20 DIAGNOSIS — L239 Allergic contact dermatitis, unspecified cause: Secondary | ICD-10-CM | POA: Diagnosis not present

## 2022-08-20 MED ORDER — HYDROXYZINE HCL 25 MG PO TABS
25.0000 mg | ORAL_TABLET | Freq: Every evening | ORAL | 3 refills | Status: AC | PRN
  Filled 2022-08-20: qty 30, 30d supply, fill #0
  Filled 2022-09-26: qty 30, 30d supply, fill #1

## 2022-08-20 MED ORDER — DUPIXENT 300 MG/2ML ~~LOC~~ SOSY
PREFILLED_SYRINGE | SUBCUTANEOUS | 5 refills | Status: DC
  Filled 2022-08-20: qty 4, fill #0
  Filled 2022-08-22: qty 4, 14d supply, fill #0
  Filled 2022-08-29: qty 4, 28d supply, fill #1
  Filled 2022-10-01: qty 4, 28d supply, fill #2
  Filled 2022-10-23: qty 4, 28d supply, fill #3
  Filled 2022-11-19: qty 4, 28d supply, fill #4
  Filled 2022-12-17: qty 4, 28d supply, fill #5

## 2022-08-20 MED ORDER — BETAMETHASONE DIPROPIONATE AUG 0.05 % EX OINT
TOPICAL_OINTMENT | Freq: Two times a day (BID) | CUTANEOUS | 2 refills | Status: AC
  Filled 2022-08-20: qty 50, 30d supply, fill #0

## 2022-08-20 MED ORDER — TRIAMCINOLONE ACETONIDE 0.1 % EX OINT
TOPICAL_OINTMENT | Freq: Two times a day (BID) | CUTANEOUS | 3 refills | Status: DC
  Filled 2022-08-20: qty 454, 30d supply, fill #0
  Filled 2022-09-26: qty 454, 90d supply, fill #1
  Filled 2022-11-30: qty 454, 90d supply, fill #2
  Filled 2023-02-23: qty 454, 90d supply, fill #3

## 2022-08-20 MED ORDER — DUPIXENT 300 MG/2ML ~~LOC~~ SOSY: PREFILLED_SYRINGE | SUBCUTANEOUS | 5 refills | Status: DC

## 2022-08-20 NOTE — Progress Notes (Signed)
   S: Patient presents for review of their specialty medication therapy.  Patient is about to start taking Dupixent for atopic dermatitis. Patient is managed by Dr. Sadie Haber for this.   Adherence: has not yet started   Efficacy: has not yet started   Dosing: 600 mg subcutaneous once followed by 300 mg subcutaneous once every 14 days  Dose adjustments: Renal: no dose adjustments (has not been studied) Hepatic: no dose adjustments (has not been studied)  Drug-drug interactions: none  Monitoring: S/sx of infection: none S/sx of hypersensitivity: none S/sx of ocular effects: none S/sx of eosinophilia/vasculitis: none  O:     Lab Results  Component Value Date   WBC 13.8 (H) 04/25/2021   HGB 14.4 04/25/2021   HCT 42.0 04/25/2021   MCV 86.6 04/25/2021   PLT 286 04/25/2021      Chemistry      Component Value Date/Time   NA 135 04/25/2021 0802   K 3.7 04/25/2021 0802   CL 101 04/25/2021 0802   CO2 26 04/25/2021 0802   BUN 14 04/25/2021 0802   CREATININE 0.84 04/25/2021 0802      Component Value Date/Time   CALCIUM 9.6 04/25/2021 0802   ALKPHOS 70 04/25/2021 0802   AST 28 04/25/2021 0802   ALT 27 04/25/2021 0802   BILITOT 1.9 (H) 04/25/2021 0802       A/P: 1. Medication review: Patient is about to be on Dupixent for atopic dermatitis. Reviewed the medication with the patient, including the following: Dupixent is a monoclonal antibody used for the treatment of asthma or atopic dermatitis. Patient educated on purpose, proper use and potential adverse effects of Dupixent. Possible adverse effects include increased risk of infection, ocular effects, vasculitis/eosinophilia, and hypersensitivity reactions. Administer as a SubQ injection and rotate sites. Allow the medication to reach room temp prior to administration (45 mins for 300 mg syringe or 30 min for 200 mg syringe). Do not shake. Discard any unused portion. No recommendations for any changes.   Butch Penny,  PharmD, P[place]y [place], CPP Clinical Pharmacist [facility] [phone]

## 2022-08-21 ENCOUNTER — Other Ambulatory Visit: Payer: Self-pay

## 2022-08-21 ENCOUNTER — Other Ambulatory Visit (HOSPITAL_COMMUNITY): Payer: Self-pay

## 2022-08-22 ENCOUNTER — Other Ambulatory Visit: Payer: Self-pay

## 2022-08-24 ENCOUNTER — Other Ambulatory Visit (HOSPITAL_COMMUNITY): Payer: Self-pay

## 2022-08-27 ENCOUNTER — Other Ambulatory Visit: Payer: Self-pay

## 2022-08-28 ENCOUNTER — Other Ambulatory Visit: Payer: Self-pay

## 2022-08-29 ENCOUNTER — Other Ambulatory Visit: Payer: Self-pay

## 2022-08-31 ENCOUNTER — Other Ambulatory Visit: Payer: Self-pay

## 2022-09-01 ENCOUNTER — Other Ambulatory Visit: Payer: Self-pay

## 2022-09-03 ENCOUNTER — Other Ambulatory Visit (HOSPITAL_COMMUNITY): Payer: Self-pay

## 2022-09-03 ENCOUNTER — Other Ambulatory Visit: Payer: Self-pay

## 2022-09-04 ENCOUNTER — Other Ambulatory Visit: Payer: Self-pay

## 2022-09-11 ENCOUNTER — Other Ambulatory Visit: Payer: Self-pay

## 2022-09-13 ENCOUNTER — Other Ambulatory Visit: Payer: Self-pay

## 2022-09-14 ENCOUNTER — Other Ambulatory Visit: Payer: Self-pay

## 2022-09-18 ENCOUNTER — Other Ambulatory Visit: Payer: Self-pay

## 2022-09-20 ENCOUNTER — Other Ambulatory Visit (HOSPITAL_COMMUNITY): Payer: Self-pay

## 2022-09-24 ENCOUNTER — Other Ambulatory Visit: Payer: Self-pay

## 2022-09-26 ENCOUNTER — Other Ambulatory Visit (HOSPITAL_COMMUNITY): Payer: Self-pay

## 2022-09-26 ENCOUNTER — Other Ambulatory Visit: Payer: Self-pay

## 2022-09-28 ENCOUNTER — Other Ambulatory Visit (HOSPITAL_COMMUNITY): Payer: Self-pay

## 2022-10-01 ENCOUNTER — Other Ambulatory Visit (HOSPITAL_COMMUNITY): Payer: Self-pay

## 2022-10-01 ENCOUNTER — Encounter (HOSPITAL_COMMUNITY): Payer: Self-pay

## 2022-10-02 ENCOUNTER — Other Ambulatory Visit: Payer: Self-pay | Admitting: Pharmacist

## 2022-10-02 ENCOUNTER — Other Ambulatory Visit: Payer: Self-pay

## 2022-10-02 ENCOUNTER — Other Ambulatory Visit (HOSPITAL_COMMUNITY): Payer: Self-pay

## 2022-10-02 DIAGNOSIS — Z79899 Other long term (current) drug therapy: Secondary | ICD-10-CM | POA: Diagnosis not present

## 2022-10-02 DIAGNOSIS — L239 Allergic contact dermatitis, unspecified cause: Secondary | ICD-10-CM | POA: Diagnosis not present

## 2022-10-02 DIAGNOSIS — L810 Postinflammatory hyperpigmentation: Secondary | ICD-10-CM | POA: Diagnosis not present

## 2022-10-02 MED ORDER — DUPIXENT 300 MG/2ML ~~LOC~~ SOSY
PREFILLED_SYRINGE | SUBCUTANEOUS | 5 refills | Status: DC
  Filled 2022-10-02: qty 4, fill #0
  Filled 2023-01-18: qty 4, 28d supply, fill #0
  Filled 2023-02-25: qty 4, 28d supply, fill #1
  Filled 2023-03-20: qty 4, 28d supply, fill #2
  Filled 2023-05-01: qty 4, 28d supply, fill #3
  Filled 2023-05-27: qty 4, 28d supply, fill #4
  Filled 2023-06-24: qty 4, 28d supply, fill #5

## 2022-10-02 MED ORDER — DUPIXENT 300 MG/2ML ~~LOC~~ SOSY: PREFILLED_SYRINGE | SUBCUTANEOUS | 5 refills | Status: DC

## 2022-10-06 ENCOUNTER — Encounter (HOSPITAL_COMMUNITY): Payer: Self-pay

## 2022-10-23 ENCOUNTER — Other Ambulatory Visit: Payer: Self-pay

## 2022-10-23 NOTE — Progress Notes (Signed)
Specialty Pharmacy Refill Coordination Note  Michael [name] is a 22 y.o. adult contacted today regarding refills of specialty medication(s) Dupilumab   Patient requested Delivery   Delivery date: 10/26/22   Verified address: [address] [place] [address], [place], [address]   Medication will be filled on 10/25/2022.

## 2022-10-25 ENCOUNTER — Other Ambulatory Visit: Payer: Self-pay

## 2022-10-29 ENCOUNTER — Other Ambulatory Visit: Payer: Self-pay

## 2022-10-30 ENCOUNTER — Other Ambulatory Visit: Payer: Self-pay

## 2022-10-31 ENCOUNTER — Other Ambulatory Visit: Payer: Self-pay

## 2022-11-08 ENCOUNTER — Other Ambulatory Visit: Payer: Self-pay

## 2022-11-09 ENCOUNTER — Other Ambulatory Visit: Payer: Self-pay

## 2022-11-12 ENCOUNTER — Other Ambulatory Visit: Payer: Self-pay

## 2022-11-13 ENCOUNTER — Other Ambulatory Visit: Payer: Self-pay

## 2022-11-19 ENCOUNTER — Other Ambulatory Visit: Payer: Self-pay

## 2022-11-19 NOTE — Progress Notes (Signed)
Specialty Pharmacy Refill Coordination Note  Michael [name] is a 22 y.o. adult contacted today regarding refills of specialty medication(s) Dupilumab   Patient requested Delivery   Delivery date: 11/23/22   Verified address: [address] [place] [address], [place], [address]   Medication will be filled on 11/22/22.

## 2022-11-30 ENCOUNTER — Other Ambulatory Visit: Payer: Self-pay

## 2022-12-05 ENCOUNTER — Other Ambulatory Visit: Payer: Self-pay

## 2022-12-07 ENCOUNTER — Other Ambulatory Visit: Payer: Self-pay

## 2022-12-12 ENCOUNTER — Other Ambulatory Visit: Payer: Self-pay

## 2022-12-12 DIAGNOSIS — E349 Endocrine disorder, unspecified: Secondary | ICD-10-CM | POA: Diagnosis not present

## 2022-12-12 DIAGNOSIS — Z79899 Other long term (current) drug therapy: Secondary | ICD-10-CM | POA: Diagnosis not present

## 2022-12-12 DIAGNOSIS — R17 Unspecified jaundice: Secondary | ICD-10-CM | POA: Diagnosis not present

## 2022-12-12 DIAGNOSIS — F649 Gender identity disorder, unspecified: Secondary | ICD-10-CM | POA: Diagnosis not present

## 2022-12-12 MED ORDER — PROGESTERONE MICRONIZED 100 MG PO CAPS
100.0000 mg | ORAL_CAPSULE | Freq: Every day | ORAL | 11 refills | Status: DC
  Filled 2022-12-12: qty 30, 30d supply, fill #0
  Filled 2023-01-14: qty 30, 30d supply, fill #1
  Filled 2023-02-13: qty 30, 30d supply, fill #2
  Filled 2023-03-13: qty 30, 30d supply, fill #3
  Filled 2023-04-16: qty 30, 30d supply, fill #4
  Filled 2023-05-13: qty 30, 30d supply, fill #5
  Filled 2023-06-13: qty 30, 30d supply, fill #6
  Filled 2023-07-17: qty 30, 30d supply, fill #7
  Filled 2023-08-12: qty 30, 30d supply, fill #8
  Filled 2023-09-11: qty 30, 30d supply, fill #9
  Filled 2023-10-14: qty 30, 30d supply, fill #10
  Filled 2023-11-13: qty 30, 30d supply, fill #11

## 2022-12-12 MED ORDER — ESTRADIOL 2 MG PO TABS
6.0000 mg | ORAL_TABLET | Freq: Every day | ORAL | 11 refills | Status: AC
Start: 2022-12-12 — End: ?
  Filled 2022-12-12: qty 90, 30d supply, fill #0

## 2022-12-12 MED ORDER — SPIRONOLACTONE 100 MG PO TABS
100.0000 mg | ORAL_TABLET | Freq: Two times a day (BID) | ORAL | 11 refills | Status: AC
  Filled 2022-12-12: qty 60, 30d supply, fill #0
  Filled 2023-02-04: qty 60, 30d supply, fill #1
  Filled 2023-03-13: qty 60, 30d supply, fill #2
  Filled 2023-04-16: qty 60, 30d supply, fill #3
  Filled 2023-05-27: qty 60, 30d supply, fill #4
  Filled 2023-07-01 – 2023-07-02 (×2): qty 60, 30d supply, fill #5
  Filled 2023-08-12: qty 60, 30d supply, fill #6
  Filled 2023-09-11: qty 60, 30d supply, fill #7
  Filled 2023-10-24: qty 60, 30d supply, fill #8
  Filled 2023-12-06: qty 60, 30d supply, fill #9

## 2022-12-14 ENCOUNTER — Other Ambulatory Visit: Payer: Self-pay

## 2022-12-17 ENCOUNTER — Other Ambulatory Visit: Payer: Self-pay

## 2022-12-17 NOTE — Progress Notes (Signed)
Specialty Pharmacy Refill Coordination Note  Michael [name] is a 22 y.o. adult contacted today regarding refills of specialty medication(s) Dupilumab   Patient requested Delivery   Delivery date: 12/26/22   Verified address: [address] [place] [address], [place], [address]   Medication will be filled on 12/25/22.

## 2022-12-18 ENCOUNTER — Other Ambulatory Visit: Payer: Self-pay

## 2022-12-18 MED ORDER — ESTRADIOL 2 MG PO TABS
5.0000 mg | ORAL_TABLET | Freq: Every day | ORAL | 11 refills | Status: AC
  Filled 2022-12-18: qty 90, 36d supply, fill #0
  Filled 2023-01-14: qty 75, 30d supply, fill #0
  Filled 2023-01-18: qty 13, 5d supply, fill #0
  Filled 2023-01-18: qty 77, 31d supply, fill #0
  Filled 2023-02-23: qty 90, 36d supply, fill #1
  Filled 2023-04-03: qty 90, 36d supply, fill #2
  Filled 2023-05-13: qty 90, 36d supply, fill #3
  Filled 2023-06-13: qty 90, 36d supply, fill #4
  Filled 2023-07-24: qty 90, 36d supply, fill #5
  Filled 2023-08-27: qty 90, 36d supply, fill #6
  Filled 2023-09-30: qty 90, 36d supply, fill #7
  Filled 2023-11-04: qty 90, 36d supply, fill #8
  Filled 2023-12-06 – 2023-12-07 (×2): qty 90, 36d supply, fill #9

## 2022-12-18 MED ORDER — ERGOCALCIFEROL 1.25 MG (50000 UT) PO CAPS
1.0000 | ORAL_CAPSULE | ORAL | 1 refills | Status: AC
  Filled 2022-12-18 – 2023-01-14 (×2): qty 8, 56d supply, fill #0

## 2022-12-25 ENCOUNTER — Other Ambulatory Visit: Payer: Self-pay

## 2022-12-31 ENCOUNTER — Other Ambulatory Visit: Payer: Self-pay

## 2023-01-07 ENCOUNTER — Other Ambulatory Visit: Payer: Self-pay

## 2023-01-14 ENCOUNTER — Other Ambulatory Visit: Payer: Self-pay

## 2023-01-15 ENCOUNTER — Other Ambulatory Visit: Payer: Self-pay

## 2023-01-17 ENCOUNTER — Other Ambulatory Visit: Payer: Self-pay

## 2023-01-17 MED ORDER — PROGESTERONE MICRONIZED 100 MG PO CAPS
100.0000 mg | ORAL_CAPSULE | Freq: Every day | ORAL | 11 refills | Status: AC
  Filled 2023-01-17: qty 30, 30d supply, fill #0

## 2023-01-17 MED ORDER — ERGOCALCIFEROL 1.25 MG (50000 UT) PO CAPS
50000.0000 [IU] | ORAL_CAPSULE | ORAL | 1 refills | Status: AC
  Filled 2023-01-17: qty 8, 56d supply, fill #0

## 2023-01-17 MED ORDER — ESTRADIOL 2 MG PO TABS
5.0000 mg | ORAL_TABLET | Freq: Every day | ORAL | 11 refills | Status: DC
  Filled 2023-01-17 – 2023-12-07 (×2): qty 90, 36d supply, fill #0

## 2023-01-18 ENCOUNTER — Other Ambulatory Visit (HOSPITAL_COMMUNITY): Payer: Self-pay

## 2023-01-18 ENCOUNTER — Other Ambulatory Visit (HOSPITAL_COMMUNITY): Payer: Self-pay | Admitting: Pharmacy Technician

## 2023-01-18 ENCOUNTER — Other Ambulatory Visit: Payer: Self-pay

## 2023-01-18 NOTE — Progress Notes (Signed)
 Specialty Pharmacy Refill Coordination Note  Michael [name] is a 23 y.o. adult contacted today regarding refills of specialty medication(s)  Dupilumab     Patient requested (Patient-Rptd) Delivery   Delivery date: (Patient-Rptd) 01/22/22   Verified address: (Patient-Rptd) [address] [place] [address] [address] [address]   Medication will be filled on 01/22/23.

## 2023-01-21 ENCOUNTER — Other Ambulatory Visit: Payer: Self-pay

## 2023-01-22 ENCOUNTER — Other Ambulatory Visit: Payer: Self-pay

## 2023-01-25 ENCOUNTER — Other Ambulatory Visit: Payer: Self-pay

## 2023-01-25 ENCOUNTER — Other Ambulatory Visit (HOSPITAL_COMMUNITY): Payer: Self-pay

## 2023-01-28 ENCOUNTER — Other Ambulatory Visit: Payer: Self-pay

## 2023-01-31 ENCOUNTER — Other Ambulatory Visit: Payer: Self-pay

## 2023-02-04 ENCOUNTER — Other Ambulatory Visit: Payer: Self-pay

## 2023-02-07 ENCOUNTER — Other Ambulatory Visit: Payer: Self-pay

## 2023-02-08 ENCOUNTER — Other Ambulatory Visit: Payer: Self-pay

## 2023-02-13 ENCOUNTER — Other Ambulatory Visit: Payer: Self-pay

## 2023-02-14 ENCOUNTER — Other Ambulatory Visit: Payer: Self-pay

## 2023-02-23 ENCOUNTER — Other Ambulatory Visit: Payer: Self-pay

## 2023-02-24 ENCOUNTER — Other Ambulatory Visit: Payer: Self-pay

## 2023-02-25 ENCOUNTER — Other Ambulatory Visit: Payer: Self-pay

## 2023-02-25 ENCOUNTER — Encounter (HOSPITAL_COMMUNITY): Payer: Self-pay

## 2023-02-25 NOTE — Progress Notes (Signed)
 Specialty Pharmacy Ongoing Clinical Assessment Note  Michael [name] is a 23 y.o. adult who is being followed by the specialty pharmacy service for RxSp Atopic Dermatitis   Patient's specialty medication(s) reviewed today: Dupilumab  (Dupixent )   Missed doses in the last 4 weeks: 0   Patient/Caregiver did not have any additional questions or concerns.   Therapeutic benefit summary: Patient is achieving benefit   Adverse events/side effects summary: No adverse events/side effects   Patient's therapy is appropriate to: Continue    Goals Addressed             This Visit's Progress    Minimize recurrence of flares       Patient is on track. Patient will maintain adherence         Follow up:  6 months  [name] Specialty Pharmacist

## 2023-02-25 NOTE — Progress Notes (Signed)
 Specialty Pharmacy Refill Coordination Note  Michael [name] is a 23 y.o. adult contacted today regarding refills of specialty medication(s) Dupilumab  (Dupixent )   Patient requested Delivery   Delivery date: 03/05/23   Verified address: [address] [place] [address] [address] [address]   Medication will be filled on 03/04/23.

## 2023-03-04 ENCOUNTER — Other Ambulatory Visit (HOSPITAL_COMMUNITY): Payer: Self-pay

## 2023-03-04 ENCOUNTER — Other Ambulatory Visit: Payer: Self-pay

## 2023-03-04 NOTE — Progress Notes (Signed)
Pharmacy Patient Advocate Encounter  Received notification from [facility] that Prior Authorization for Dupixent has been APPROVED from 03/04/23 to 03/02/24   PA #/Case ID/Reference #: [id number]

## 2023-03-04 NOTE — Progress Notes (Signed)
Pharmacy Patient Advocate Encounter   Received notification from Patient Pharmacy that prior authorization for Dupixent is required/requested.   Insurance verification completed.   The patient is insured through [facility] .   Per test claim: PA required; PA submitted to above mentioned insurance via CoverMyMeds Key/confirmation #/EOC [facility] Status is pending

## 2023-03-13 ENCOUNTER — Other Ambulatory Visit: Payer: Self-pay

## 2023-03-14 ENCOUNTER — Other Ambulatory Visit: Payer: Self-pay

## 2023-03-15 ENCOUNTER — Other Ambulatory Visit: Payer: Self-pay

## 2023-03-18 ENCOUNTER — Other Ambulatory Visit: Payer: Self-pay

## 2023-03-18 ENCOUNTER — Other Ambulatory Visit (HOSPITAL_COMMUNITY): Payer: Self-pay

## 2023-03-20 ENCOUNTER — Other Ambulatory Visit: Payer: Self-pay | Admitting: Pharmacy Technician

## 2023-03-20 ENCOUNTER — Other Ambulatory Visit: Payer: Self-pay

## 2023-03-20 NOTE — Progress Notes (Signed)
 Specialty Pharmacy Refill Coordination Note  Michael [name] is a 23 y.o. adult contacted today regarding refills of specialty medication(s) Dupilumab (Dupixent)   Patient requested Delivery   Delivery date: 04/02/23   Verified address: Patient address [address] [address]  [place]   Medication will be filled on 04/01/23.

## 2023-04-01 ENCOUNTER — Other Ambulatory Visit: Payer: Self-pay

## 2023-04-01 DIAGNOSIS — L810 Postinflammatory hyperpigmentation: Secondary | ICD-10-CM | POA: Diagnosis not present

## 2023-04-01 DIAGNOSIS — L239 Allergic contact dermatitis, unspecified cause: Secondary | ICD-10-CM | POA: Diagnosis not present

## 2023-04-01 DIAGNOSIS — Z79899 Other long term (current) drug therapy: Secondary | ICD-10-CM | POA: Diagnosis not present

## 2023-04-01 MED ORDER — DUPIXENT 300 MG/2ML ~~LOC~~ SOSY
PREFILLED_SYRINGE | SUBCUTANEOUS | 11 refills | Status: DC
  Filled 2023-04-01: qty 4, 14d supply, fill #0

## 2023-04-04 ENCOUNTER — Other Ambulatory Visit: Payer: Self-pay

## 2023-04-05 ENCOUNTER — Other Ambulatory Visit: Payer: Self-pay

## 2023-04-09 ENCOUNTER — Other Ambulatory Visit: Payer: Self-pay

## 2023-04-09 DIAGNOSIS — L2089 Other atopic dermatitis: Secondary | ICD-10-CM | POA: Diagnosis not present

## 2023-04-09 DIAGNOSIS — M545 Low back pain, unspecified: Secondary | ICD-10-CM | POA: Diagnosis not present

## 2023-04-09 MED ORDER — CYCLOBENZAPRINE HCL 5 MG PO TABS
5.0000 mg | ORAL_TABLET | Freq: Three times a day (TID) | ORAL | 0 refills | Status: AC | PRN
  Filled 2023-04-09: qty 30, 10d supply, fill #0

## 2023-04-16 ENCOUNTER — Other Ambulatory Visit: Payer: Self-pay

## 2023-04-25 ENCOUNTER — Other Ambulatory Visit: Payer: Self-pay

## 2023-04-29 ENCOUNTER — Other Ambulatory Visit: Payer: Self-pay

## 2023-04-29 DIAGNOSIS — J3489 Other specified disorders of nose and nasal sinuses: Secondary | ICD-10-CM | POA: Diagnosis not present

## 2023-04-29 DIAGNOSIS — R051 Acute cough: Secondary | ICD-10-CM | POA: Diagnosis not present

## 2023-04-29 MED ORDER — PROMETHAZINE-DM 6.25-15 MG/5ML PO SYRP
5.0000 mL | ORAL_SOLUTION | Freq: Every day | ORAL | 0 refills | Status: AC
  Filled 2023-04-29: qty 35, 7d supply, fill #0

## 2023-04-30 DIAGNOSIS — R051 Acute cough: Secondary | ICD-10-CM | POA: Diagnosis not present

## 2023-05-01 ENCOUNTER — Other Ambulatory Visit: Payer: Self-pay

## 2023-05-01 NOTE — Progress Notes (Signed)
 Specialty Pharmacy Refill Coordination Note  Michael [name] is a 23 y.o. adult contacted today regarding refills of specialty medication(s) Dupilumab (Dupixent)   Patient requested Delivery   Delivery date: 05/02/23   Verified address: Patient address [address] [address]  [place]   Medication will be filled on 05/01/23.

## 2023-05-02 ENCOUNTER — Other Ambulatory Visit: Payer: Self-pay

## 2023-05-02 DIAGNOSIS — R0981 Nasal congestion: Secondary | ICD-10-CM | POA: Diagnosis not present

## 2023-05-02 MED ORDER — AMOXICILLIN-POT CLAVULANATE 875-125 MG PO TABS
1.0000 | ORAL_TABLET | Freq: Two times a day (BID) | ORAL | 0 refills | Status: AC
  Filled 2023-05-02: qty 14, 7d supply, fill #0

## 2023-05-13 ENCOUNTER — Other Ambulatory Visit: Payer: Self-pay

## 2023-05-14 ENCOUNTER — Other Ambulatory Visit: Payer: Self-pay

## 2023-05-21 ENCOUNTER — Other Ambulatory Visit: Payer: Self-pay

## 2023-05-27 ENCOUNTER — Other Ambulatory Visit: Payer: Self-pay

## 2023-05-27 ENCOUNTER — Other Ambulatory Visit: Payer: Self-pay | Admitting: Pharmacy Technician

## 2023-05-27 NOTE — Progress Notes (Signed)
 Specialty Pharmacy Refill Coordination Note  Michael [name] is a 23 y.o. adult contacted today regarding refills of specialty medication(s) Dupilumab  (Dupixent )   Patient requested Delivery   Delivery date: 05/29/23   Verified address: [address] [address] [place]   Medication will be filled on 05/28/23.

## 2023-05-28 ENCOUNTER — Other Ambulatory Visit: Payer: Self-pay

## 2023-06-07 ENCOUNTER — Other Ambulatory Visit: Payer: Self-pay

## 2023-06-13 ENCOUNTER — Other Ambulatory Visit: Payer: Self-pay

## 2023-06-14 ENCOUNTER — Other Ambulatory Visit: Payer: Self-pay

## 2023-06-24 ENCOUNTER — Other Ambulatory Visit: Payer: Self-pay

## 2023-06-24 NOTE — Progress Notes (Signed)
 Specialty Pharmacy Refill Coordination Note  Michael [name] is a 23 y.o. adult contacted today regarding refills of specialty medication(s) Dupilumab  (Dupixent )   Patient requested Delivery   Delivery date: 06/26/23   Verified address: [address] [address]   [place] [address]   Medication will be filled on 06/25/23.

## 2023-06-27 IMAGING — CR DG ABDOMEN ACUTE W/ 1V CHEST
3 series · 3 of 3 positions shown · non-contrast
Comparison: Chest x-ray 12/13/2007

CLINICAL DATA: Constipation for 4 days.

EXAM:
DG ABDOMEN ACUTE WITH 1 VIEW CHEST

[chest pa]
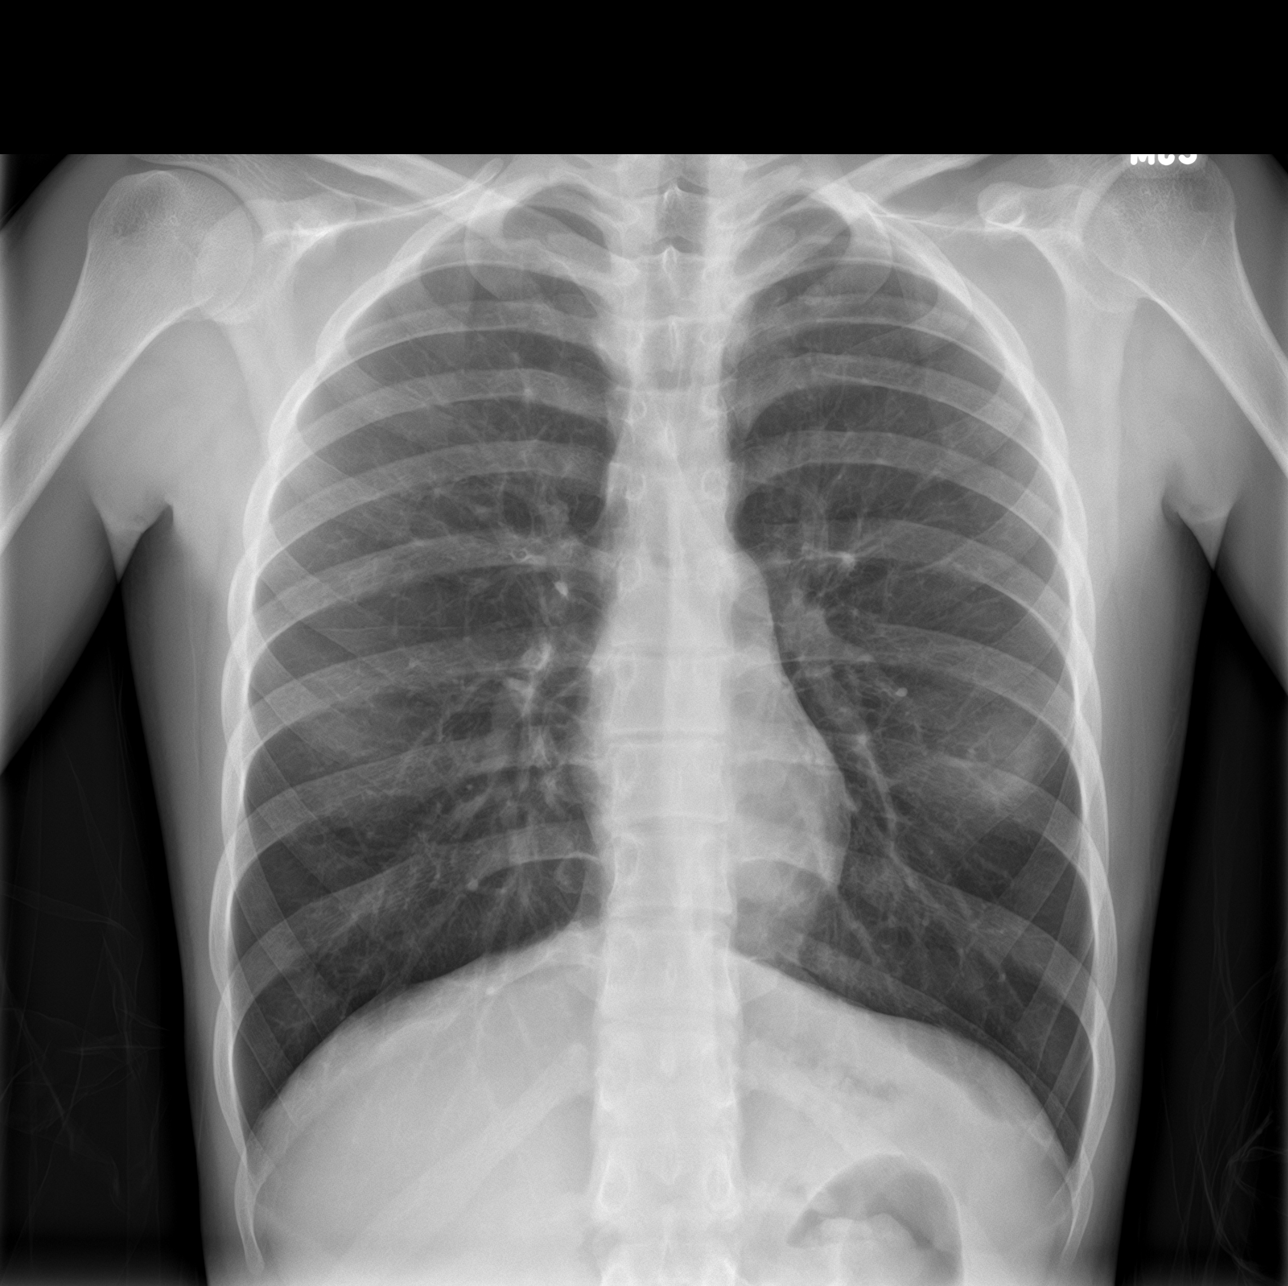

[abdomen erect]
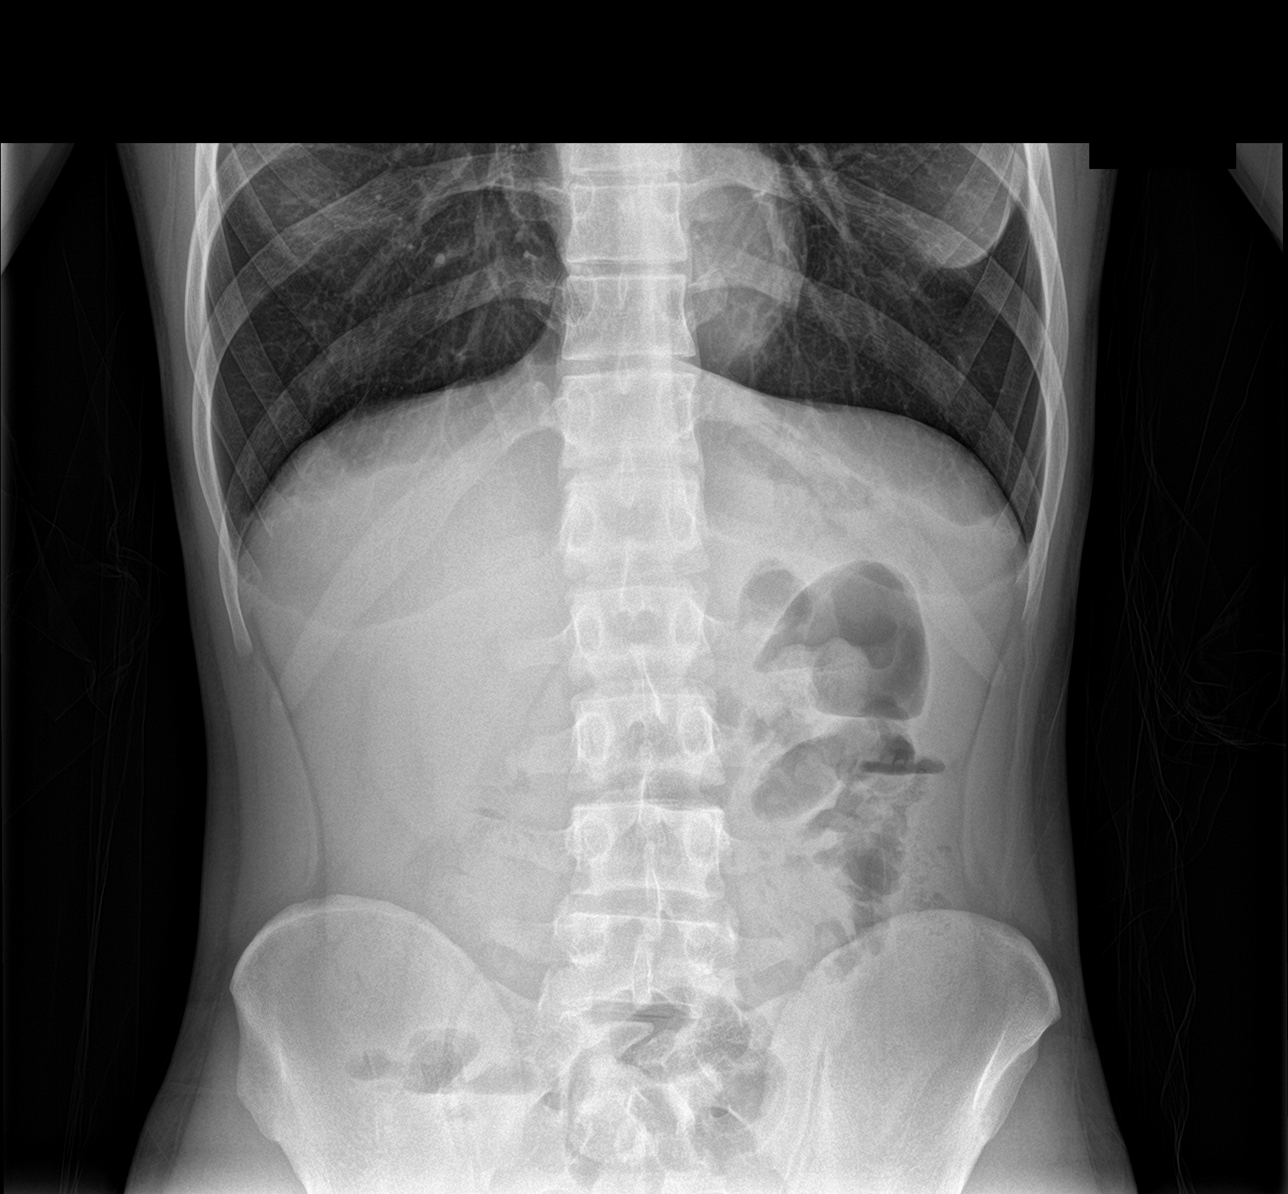

[abdomen supine]
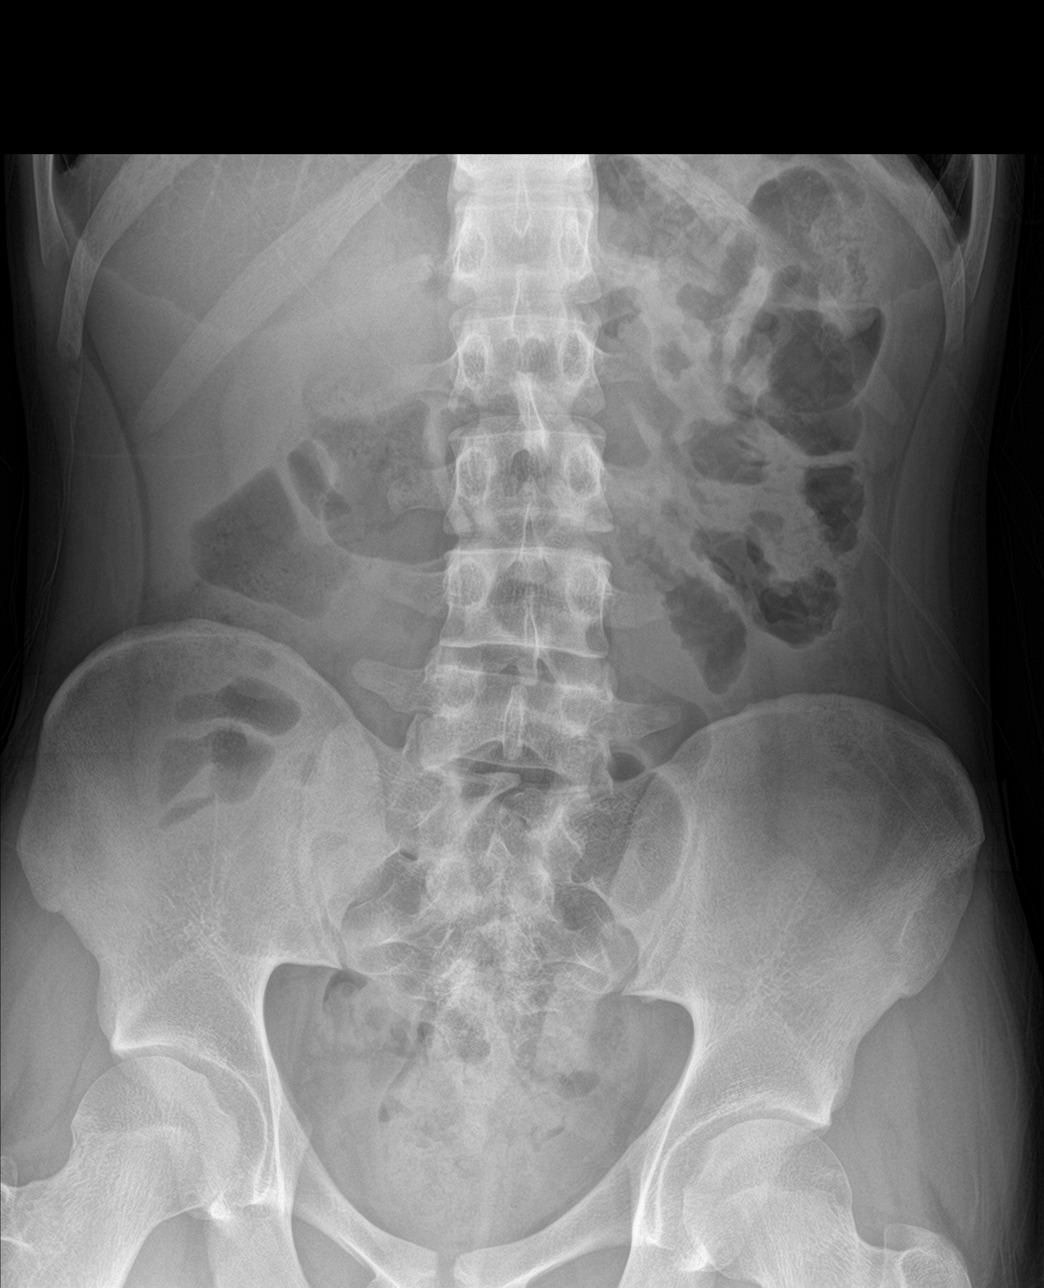

[3 of 3 positions shown; findings below may reference images not displayed]

FINDINGS: No focal consolidation. No pleural effusion or pneumothorax. Heart
and mediastinal contours are unremarkable.

No bowel dilatation to suggest obstruction. No air-fluid levels. No
pneumoperitoneum, portal venous gas, or pneumatosis. No
urolithiasis.

No acute osseous abnormality.
IMPRESSION: Negative abdominal radiographs.  No acute cardiopulmonary disease.

## 2023-07-01 ENCOUNTER — Other Ambulatory Visit: Payer: Self-pay

## 2023-07-02 ENCOUNTER — Other Ambulatory Visit: Payer: Self-pay

## 2023-07-17 ENCOUNTER — Other Ambulatory Visit: Payer: Self-pay

## 2023-07-22 ENCOUNTER — Other Ambulatory Visit: Payer: Self-pay

## 2023-07-22 ENCOUNTER — Other Ambulatory Visit: Payer: Self-pay | Admitting: Pharmacist

## 2023-07-22 ENCOUNTER — Other Ambulatory Visit (HOSPITAL_COMMUNITY): Payer: Self-pay

## 2023-07-22 MED ORDER — DUPIXENT 300 MG/2ML ~~LOC~~ SOSY
PREFILLED_SYRINGE | SUBCUTANEOUS | 11 refills | Status: AC
  Filled 2023-07-22 – 2023-07-24 (×2): qty 4, 28d supply, fill #0
  Filled 2023-08-15 – 2023-09-11 (×2): qty 4, 28d supply, fill #1
  Filled 2023-10-07: qty 4, 28d supply, fill #2
  Filled 2023-11-14 – 2023-11-25 (×2): qty 4, 28d supply, fill #3
  Filled 2023-12-17 – 2023-12-27 (×2): qty 4, 28d supply, fill #4
  Filled 2024-01-20 – 2024-01-22 (×5): qty 4, 28d supply, fill #5

## 2023-07-23 ENCOUNTER — Other Ambulatory Visit (HOSPITAL_COMMUNITY): Payer: Self-pay

## 2023-07-24 ENCOUNTER — Other Ambulatory Visit: Payer: Self-pay | Admitting: Pharmacy Technician

## 2023-07-24 ENCOUNTER — Other Ambulatory Visit: Payer: Self-pay

## 2023-07-24 NOTE — Progress Notes (Signed)
 Specialty Pharmacy Refill Coordination Note  Michael [name] is a 23 y.o. adult contacted today regarding refills of specialty medication(s) Dupilumab  (Dupixent )   Patient requested Delivery   Delivery date: 07/26/23   Verified address: [address] [place] [address] [place]   Medication will be filled on 07/25/23.

## 2023-07-25 ENCOUNTER — Other Ambulatory Visit (HOSPITAL_COMMUNITY): Payer: Self-pay

## 2023-07-25 ENCOUNTER — Other Ambulatory Visit: Payer: Self-pay

## 2023-08-07 ENCOUNTER — Other Ambulatory Visit: Payer: Self-pay

## 2023-08-12 ENCOUNTER — Other Ambulatory Visit: Payer: Self-pay

## 2023-08-14 ENCOUNTER — Other Ambulatory Visit: Payer: Self-pay

## 2023-08-14 ENCOUNTER — Other Ambulatory Visit (HOSPITAL_COMMUNITY): Payer: Self-pay

## 2023-08-15 ENCOUNTER — Other Ambulatory Visit (HOSPITAL_COMMUNITY): Payer: Self-pay

## 2023-08-19 ENCOUNTER — Other Ambulatory Visit: Payer: Self-pay

## 2023-08-27 ENCOUNTER — Ambulatory Visit: Attending: Family Medicine | Admitting: Pharmacist

## 2023-08-27 ENCOUNTER — Other Ambulatory Visit: Payer: Self-pay

## 2023-08-27 DIAGNOSIS — Z79899 Other long term (current) drug therapy: Secondary | ICD-10-CM

## 2023-08-27 NOTE — Progress Notes (Signed)
   S: Patient presents for review of their specialty medication therapy.  Patient is about to start taking Dupixent for atopic dermatitis. Patient is managed by Dr. Sadie Haber for this.   Adherence: has not yet started   Efficacy: has not yet started   Dosing: 600 mg subcutaneous once followed by 300 mg subcutaneous once every 14 days  Dose adjustments: Renal: no dose adjustments (has not been studied) Hepatic: no dose adjustments (has not been studied)  Drug-drug interactions: none  Monitoring: S/sx of infection: none S/sx of hypersensitivity: none S/sx of ocular effects: none S/sx of eosinophilia/vasculitis: none  O:     Lab Results  Component Value Date   WBC 13.8 (H) 04/25/2021   HGB 14.4 04/25/2021   HCT 42.0 04/25/2021   MCV 86.6 04/25/2021   PLT 286 04/25/2021      Chemistry      Component Value Date/Time   NA 135 04/25/2021 0802   K 3.7 04/25/2021 0802   CL 101 04/25/2021 0802   CO2 26 04/25/2021 0802   BUN 14 04/25/2021 0802   CREATININE 0.84 04/25/2021 0802      Component Value Date/Time   CALCIUM 9.6 04/25/2021 0802   ALKPHOS 70 04/25/2021 0802   AST 28 04/25/2021 0802   ALT 27 04/25/2021 0802   BILITOT 1.9 (H) 04/25/2021 0802       A/P: 1. Medication review: Patient is about to be on Dupixent for atopic dermatitis. Reviewed the medication with the patient, including the following: Dupixent is a monoclonal antibody used for the treatment of asthma or atopic dermatitis. Patient educated on purpose, proper use and potential adverse effects of Dupixent. Possible adverse effects include increased risk of infection, ocular effects, vasculitis/eosinophilia, and hypersensitivity reactions. Administer as a SubQ injection and rotate sites. Allow the medication to reach room temp prior to administration (45 mins for 300 mg syringe or 30 min for 200 mg syringe). Do not shake. Discard any unused portion. No recommendations for any changes.   Butch Penny,  PharmD, P[place]y [place], CPP Clinical Pharmacist [facility] [phone]

## 2023-08-28 ENCOUNTER — Other Ambulatory Visit: Payer: Self-pay

## 2023-09-04 ENCOUNTER — Other Ambulatory Visit: Payer: Self-pay

## 2023-09-11 ENCOUNTER — Other Ambulatory Visit: Payer: Self-pay

## 2023-09-11 NOTE — Progress Notes (Signed)
 Specialty Pharmacy Refill Coordination Note  Michael [name] is a 23 y.o. adult contacted today regarding refills of specialty medication(s) Dupilumab  (Dupixent )   Patient requested Delivery   Delivery date: 09/13/23   Verified address: [address] [place] [address] [place]   Medication will be filled on 09/12/23.

## 2023-09-30 ENCOUNTER — Other Ambulatory Visit: Payer: Self-pay

## 2023-10-01 ENCOUNTER — Other Ambulatory Visit: Payer: Self-pay

## 2023-10-03 ENCOUNTER — Other Ambulatory Visit (HOSPITAL_COMMUNITY): Payer: Self-pay

## 2023-10-07 ENCOUNTER — Other Ambulatory Visit (HOSPITAL_COMMUNITY): Payer: Self-pay

## 2023-10-09 ENCOUNTER — Other Ambulatory Visit: Payer: Self-pay

## 2023-10-14 ENCOUNTER — Other Ambulatory Visit: Payer: Self-pay

## 2023-10-15 ENCOUNTER — Other Ambulatory Visit (HOSPITAL_COMMUNITY): Payer: Self-pay

## 2023-10-21 ENCOUNTER — Other Ambulatory Visit: Payer: Self-pay

## 2023-10-21 NOTE — Progress Notes (Signed)
 Specialty Pharmacy Refill Coordination Note  Michael [name] is a 23 y.o. adult contacted today regarding refills of specialty medication(s) Dupilumab  (Dupixent )   Patient requested Delivery   Delivery date: 10/23/23   Verified address: [address] [name]   Medication will be filled on 10/22/23

## 2023-10-24 ENCOUNTER — Other Ambulatory Visit: Payer: Self-pay

## 2023-11-04 ENCOUNTER — Other Ambulatory Visit: Payer: Self-pay

## 2023-11-05 ENCOUNTER — Other Ambulatory Visit: Payer: Self-pay

## 2023-11-12 ENCOUNTER — Other Ambulatory Visit (HOSPITAL_BASED_OUTPATIENT_CLINIC_OR_DEPARTMENT_OTHER): Payer: Self-pay

## 2023-11-13 ENCOUNTER — Other Ambulatory Visit: Payer: Self-pay

## 2023-11-14 ENCOUNTER — Other Ambulatory Visit (HOSPITAL_COMMUNITY): Payer: Self-pay

## 2023-11-18 ENCOUNTER — Other Ambulatory Visit: Payer: Self-pay

## 2023-11-25 ENCOUNTER — Other Ambulatory Visit: Payer: Self-pay

## 2023-11-25 NOTE — Progress Notes (Signed)
 Specialty Pharmacy Ongoing Clinical Assessment Note  Michael [name] is a 23 y.o. adult who is being followed by the specialty pharmacy service for RxSp Atopic Dermatitis   Patient's specialty medication(s) reviewed today: Dupilumab  (Dupixent )   Missed doses in the last 4 weeks: 0   Patient/Caregiver did not have any additional questions or concerns.   Therapeutic benefit summary: Patient is achieving benefit   Adverse events/side effects summary: No adverse events/side effects   Patient's therapy is appropriate to: Continue    Goals Addressed             This Visit's Progress    Minimize recurrence of flares   On track    Patient is on track. Patient will maintain adherence         Follow up: 12 months  [facility]

## 2023-11-25 NOTE — Progress Notes (Signed)
 Specialty Pharmacy Refill Coordination Note  Michael [name] Held is a 23 y.o. adult contacted today regarding refills of specialty medication(s) Dupilumab  (Dupixent )   Patient requested Delivery   Delivery date: 11/27/23   Verified address: [address] [place] [address] [place]   Medication will be filled on: 11/26/23

## 2023-12-06 ENCOUNTER — Other Ambulatory Visit: Payer: Self-pay

## 2023-12-06 MED ORDER — PROGESTERONE MICRONIZED 100 MG PO CAPS
100.0000 mg | ORAL_CAPSULE | Freq: Every day | ORAL | 11 refills | Status: AC
  Filled 2023-12-06 – 2023-12-07 (×2): qty 30, 30d supply, fill #0

## 2023-12-07 ENCOUNTER — Other Ambulatory Visit: Payer: Self-pay

## 2023-12-09 ENCOUNTER — Other Ambulatory Visit: Payer: Self-pay

## 2023-12-10 ENCOUNTER — Other Ambulatory Visit: Payer: Self-pay

## 2023-12-11 ENCOUNTER — Other Ambulatory Visit: Payer: Self-pay

## 2023-12-11 DIAGNOSIS — Z79899 Other long term (current) drug therapy: Secondary | ICD-10-CM | POA: Diagnosis not present

## 2023-12-11 DIAGNOSIS — E349 Endocrine disorder, unspecified: Secondary | ICD-10-CM | POA: Diagnosis not present

## 2023-12-11 DIAGNOSIS — Z1331 Encounter for screening for depression: Secondary | ICD-10-CM | POA: Diagnosis not present

## 2023-12-11 DIAGNOSIS — F649 Gender identity disorder, unspecified: Secondary | ICD-10-CM | POA: Diagnosis not present

## 2023-12-11 MED ORDER — BD LUER-LOK SYRINGE 18G X 1-1/2" 3 ML MISC
1 refills | Status: AC
  Filled 2023-12-11: qty 25, 25d supply, fill #0

## 2023-12-11 MED ORDER — PROGESTERONE 200 MG PO CAPS
200.0000 mg | ORAL_CAPSULE | Freq: Every day | ORAL | 3 refills | Status: AC
Start: 2023-12-11 — End: ?
  Filled 2023-12-11: qty 90, 90d supply, fill #0

## 2023-12-11 MED ORDER — SPIRONOLACTONE 100 MG PO TABS
100.0000 mg | ORAL_TABLET | Freq: Two times a day (BID) | ORAL | 11 refills | Status: AC
  Filled 2024-01-10: qty 60, 30d supply, fill #0

## 2023-12-11 MED ORDER — MONOJECT HYPODERMIC NEEDLE 25G X 5/8" MISC
3 refills | Status: AC
  Filled 2023-12-11: qty 30, 30d supply, fill #0

## 2023-12-11 MED ORDER — ESTRADIOL VALERATE 20 MG/ML IM OIL
20.0000 mg | TOPICAL_OIL | INTRAMUSCULAR | 3 refills | Status: AC
  Filled 2023-12-11: qty 5, 28d supply, fill #0

## 2023-12-17 ENCOUNTER — Other Ambulatory Visit: Payer: Self-pay

## 2023-12-23 ENCOUNTER — Other Ambulatory Visit (HOSPITAL_COMMUNITY): Payer: Self-pay

## 2023-12-23 ENCOUNTER — Other Ambulatory Visit: Payer: Self-pay

## 2023-12-23 DIAGNOSIS — J019 Acute sinusitis, unspecified: Secondary | ICD-10-CM | POA: Diagnosis not present

## 2023-12-23 DIAGNOSIS — B9689 Other specified bacterial agents as the cause of diseases classified elsewhere: Secondary | ICD-10-CM | POA: Diagnosis not present

## 2023-12-23 MED ORDER — AMOXICILLIN-POT CLAVULANATE 875-125 MG PO TABS
1.0000 | ORAL_TABLET | Freq: Two times a day (BID) | ORAL | 0 refills | Status: AC
  Filled 2023-12-23: qty 20, 10d supply, fill #0

## 2023-12-23 MED ORDER — PROMETHAZINE-DM 6.25-15 MG/5ML PO SYRP
5.0000 mL | ORAL_SOLUTION | Freq: Every evening | ORAL | 0 refills | Status: AC | PRN
  Filled 2023-12-23: qty 35, 7d supply, fill #0

## 2023-12-23 MED ORDER — BENZONATATE 200 MG PO CAPS
200.0000 mg | ORAL_CAPSULE | Freq: Three times a day (TID) | ORAL | 0 refills | Status: AC | PRN
  Filled 2023-12-23: qty 20, 7d supply, fill #0

## 2023-12-27 ENCOUNTER — Other Ambulatory Visit: Payer: Self-pay

## 2023-12-27 NOTE — Progress Notes (Signed)
 Specialty Pharmacy Refill Coordination Note  Michael [name] is a 23 y.o. adult contacted today regarding refills of specialty medication(s) Dupilumab  (Dupixent )   Patient requested Delivery   Delivery date: 12/30/23   Verified address: [address] [place] [address] [place]   Medication will be filled on: 12/30/23

## 2023-12-30 ENCOUNTER — Other Ambulatory Visit: Payer: Self-pay

## 2024-01-06 ENCOUNTER — Other Ambulatory Visit: Payer: Self-pay

## 2024-01-07 ENCOUNTER — Other Ambulatory Visit: Payer: Self-pay

## 2024-01-10 ENCOUNTER — Other Ambulatory Visit: Payer: Self-pay

## 2024-01-20 ENCOUNTER — Other Ambulatory Visit: Payer: Self-pay

## 2024-01-22 ENCOUNTER — Telehealth: Payer: Self-pay

## 2024-01-22 ENCOUNTER — Other Ambulatory Visit: Payer: Self-pay

## 2024-01-22 ENCOUNTER — Other Ambulatory Visit (HOSPITAL_COMMUNITY): Payer: Self-pay

## 2024-01-22 NOTE — Telephone Encounter (Signed)
 Manufacturer copay card is applying, but limited due to an incorrect copay maximizer flag on the manufacturer side.  Luke is actively working with [employer] to review plan configuration and determine if any corrections can be made on insurance side. At this time, the patient will need to contact the manufacturer copay card program and tell them to remove flag so they will have access to full funding.   Patient advised via MyChart to contact the manufacturer copay card progam directly, provide insurance details and request flag be removed so full benefits can be applied. Instructed patient to call pharmacy once issue resolved.

## 2024-01-23 ENCOUNTER — Other Ambulatory Visit: Payer: Self-pay

## 2024-01-27 ENCOUNTER — Other Ambulatory Visit: Payer: Self-pay

## 2024-02-06 ENCOUNTER — Other Ambulatory Visit: Payer: Self-pay

## 2024-02-07 ENCOUNTER — Other Ambulatory Visit: Payer: Self-pay

## 2024-02-07 MED ORDER — TRIAMCINOLONE ACETONIDE 0.1 % EX OINT
TOPICAL_OINTMENT | Freq: Two times a day (BID) | CUTANEOUS | 3 refills | Status: AC
  Filled 2024-02-07: qty 454, 30d supply, fill #0

## 2024-02-10 ENCOUNTER — Other Ambulatory Visit: Payer: Self-pay

## 2024-02-18 ENCOUNTER — Telehealth: Payer: Self-pay

## 2024-02-18 ENCOUNTER — Other Ambulatory Visit: Payer: Self-pay

## 2024-02-18 NOTE — Telephone Encounter (Signed)
 Pharmacy Patient Advocate Encounter  Received notification from [facility] that Prior Authorization for Dupixent  has been APPROVED from 03/03/24 to 03/02/25   PA #/Case ID/Reference #: [id number]

## 2024-02-18 NOTE — Telephone Encounter (Signed)
 Pharmacy Patient Advocate Encounter   Received notification from [facility] Mess[facility] that prior authorization for Dupixent  is required/requested.   Insurance verification completed.   The patient is insured through [facility].   Per test claim: PA required; PA submitted to above mentioned insurance via Latent Key/confirmation #/EOC [facility] Status is pending

## 2024-02-21 ENCOUNTER — Other Ambulatory Visit: Payer: Self-pay
